# Patient Record
Sex: Male | Born: 1940 | Race: White | Hispanic: No | Marital: Married | Smoking: Never smoker
Health system: Southern US, Community
[De-identification: ages and names within clinical notes are randomized; demographics above are authoritative.]

## PROBLEM LIST (undated history)

## (undated) DIAGNOSIS — G8250 Quadriplegia, unspecified: Secondary | ICD-10-CM

## (undated) DIAGNOSIS — N319 Neuromuscular dysfunction of bladder, unspecified: Secondary | ICD-10-CM

## (undated) DIAGNOSIS — E785 Hyperlipidemia, unspecified: Secondary | ICD-10-CM

## (undated) DIAGNOSIS — I10 Essential (primary) hypertension: Secondary | ICD-10-CM

## (undated) DIAGNOSIS — I4891 Unspecified atrial fibrillation: Secondary | ICD-10-CM

## (undated) DIAGNOSIS — F329 Major depressive disorder, single episode, unspecified: Secondary | ICD-10-CM

## (undated) DIAGNOSIS — A4902 Methicillin resistant Staphylococcus aureus infection, unspecified site: Secondary | ICD-10-CM

## (undated) DIAGNOSIS — I2510 Atherosclerotic heart disease of native coronary artery without angina pectoris: Secondary | ICD-10-CM

## (undated) DIAGNOSIS — F32A Depression, unspecified: Secondary | ICD-10-CM

## (undated) DIAGNOSIS — K604 Rectal fistula, unspecified: Secondary | ICD-10-CM

## (undated) DIAGNOSIS — E119 Type 2 diabetes mellitus without complications: Secondary | ICD-10-CM

## (undated) DIAGNOSIS — J9610 Chronic respiratory failure, unspecified whether with hypoxia or hypercapnia: Secondary | ICD-10-CM

## (undated) DIAGNOSIS — J449 Chronic obstructive pulmonary disease, unspecified: Secondary | ICD-10-CM

## (undated) DIAGNOSIS — Z8679 Personal history of other diseases of the circulatory system: Secondary | ICD-10-CM

## (undated) DIAGNOSIS — J189 Pneumonia, unspecified organism: Secondary | ICD-10-CM

## (undated) HISTORY — PX: NECK SURGERY: SHX720

## (undated) HISTORY — PX: PACEMAKER PLACEMENT: SHX43

## (undated) HISTORY — PX: TRACHEOSTOMY: SUR1362

## (undated) HISTORY — PX: PERIPHERALLY INSERTED CENTRAL CATHETER INSERTION: SHX2221

---

## 2015-07-27 ENCOUNTER — Inpatient Hospital Stay (HOSPITAL_COMMUNITY)
Admission: EM | Admit: 2015-07-27 | Discharge: 2015-08-06 | DRG: 870 | Disposition: A | Discharge status: Other Acute Inpatient Hospital | Payer: Medicare Other | Attending: Internal Medicine | Admitting: Internal Medicine

## 2015-07-27 ENCOUNTER — Emergency Department (HOSPITAL_COMMUNITY): Payer: Medicare Other

## 2015-07-27 ENCOUNTER — Encounter (HOSPITAL_COMMUNITY): Payer: Self-pay

## 2015-07-27 DIAGNOSIS — J189 Pneumonia, unspecified organism: Secondary | ICD-10-CM

## 2015-07-27 DIAGNOSIS — J9621 Acute and chronic respiratory failure with hypoxia: Secondary | ICD-10-CM | POA: Diagnosis present

## 2015-07-27 DIAGNOSIS — R590 Localized enlarged lymph nodes: Secondary | ICD-10-CM | POA: Diagnosis not present

## 2015-07-27 DIAGNOSIS — J9612 Chronic respiratory failure with hypercapnia: Secondary | ICD-10-CM | POA: Diagnosis present

## 2015-07-27 DIAGNOSIS — E662 Morbid (severe) obesity with alveolar hypoventilation: Secondary | ICD-10-CM | POA: Diagnosis present

## 2015-07-27 DIAGNOSIS — B370 Candidal stomatitis: Secondary | ICD-10-CM | POA: Diagnosis present

## 2015-07-27 DIAGNOSIS — I82492 Acute embolism and thrombosis of other specified deep vein of left lower extremity: Secondary | ICD-10-CM | POA: Diagnosis present

## 2015-07-27 DIAGNOSIS — A419 Sepsis, unspecified organism: Secondary | ICD-10-CM | POA: Diagnosis present

## 2015-07-27 DIAGNOSIS — E872 Acidosis: Secondary | ICD-10-CM | POA: Diagnosis not present

## 2015-07-27 DIAGNOSIS — I420 Dilated cardiomyopathy: Secondary | ICD-10-CM | POA: Diagnosis present

## 2015-07-27 DIAGNOSIS — I4891 Unspecified atrial fibrillation: Secondary | ICD-10-CM | POA: Diagnosis present

## 2015-07-27 DIAGNOSIS — I9589 Other hypotension: Secondary | ICD-10-CM | POA: Diagnosis present

## 2015-07-27 DIAGNOSIS — E1122 Type 2 diabetes mellitus with diabetic chronic kidney disease: Secondary | ICD-10-CM | POA: Diagnosis not present

## 2015-07-27 DIAGNOSIS — B964 Proteus (mirabilis) (morganii) as the cause of diseases classified elsewhere: Secondary | ICD-10-CM | POA: Diagnosis not present

## 2015-07-27 DIAGNOSIS — N390 Urinary tract infection, site not specified: Secondary | ICD-10-CM | POA: Diagnosis present

## 2015-07-27 DIAGNOSIS — Z8249 Family history of ischemic heart disease and other diseases of the circulatory system: Secondary | ICD-10-CM

## 2015-07-27 DIAGNOSIS — Z930 Tracheostomy status: Secondary | ICD-10-CM | POA: Diagnosis not present

## 2015-07-27 DIAGNOSIS — B965 Pseudomonas (aeruginosa) (mallei) (pseudomallei) as the cause of diseases classified elsewhere: Secondary | ICD-10-CM | POA: Diagnosis present

## 2015-07-27 DIAGNOSIS — Y95 Nosocomial condition: Secondary | ICD-10-CM | POA: Diagnosis not present

## 2015-07-27 DIAGNOSIS — Z823 Family history of stroke: Secondary | ICD-10-CM | POA: Diagnosis not present

## 2015-07-27 DIAGNOSIS — R0602 Shortness of breath: Secondary | ICD-10-CM

## 2015-07-27 DIAGNOSIS — Z9911 Dependence on respirator [ventilator] status: Secondary | ICD-10-CM | POA: Diagnosis not present

## 2015-07-27 DIAGNOSIS — I130 Hypertensive heart and chronic kidney disease with heart failure and stage 1 through stage 4 chronic kidney disease, or unspecified chronic kidney disease: Secondary | ICD-10-CM | POA: Diagnosis present

## 2015-07-27 DIAGNOSIS — I2510 Atherosclerotic heart disease of native coronary artery without angina pectoris: Secondary | ICD-10-CM | POA: Diagnosis not present

## 2015-07-27 DIAGNOSIS — I071 Rheumatic tricuspid insufficiency: Secondary | ICD-10-CM | POA: Diagnosis present

## 2015-07-27 DIAGNOSIS — J440 Chronic obstructive pulmonary disease with acute lower respiratory infection: Secondary | ICD-10-CM | POA: Diagnosis present

## 2015-07-27 DIAGNOSIS — I5033 Acute on chronic diastolic (congestive) heart failure: Secondary | ICD-10-CM | POA: Diagnosis not present

## 2015-07-27 DIAGNOSIS — Z794 Long term (current) use of insulin: Secondary | ICD-10-CM | POA: Diagnosis not present

## 2015-07-27 DIAGNOSIS — N189 Chronic kidney disease, unspecified: Secondary | ICD-10-CM | POA: Diagnosis present

## 2015-07-27 DIAGNOSIS — R0902 Hypoxemia: Secondary | ICD-10-CM

## 2015-07-27 DIAGNOSIS — J15212 Pneumonia due to Methicillin resistant Staphylococcus aureus: Secondary | ICD-10-CM | POA: Diagnosis present

## 2015-07-27 DIAGNOSIS — D649 Anemia, unspecified: Secondary | ICD-10-CM | POA: Diagnosis not present

## 2015-07-27 DIAGNOSIS — R1319 Other dysphagia: Secondary | ICD-10-CM | POA: Diagnosis not present

## 2015-07-27 DIAGNOSIS — Z950 Presence of cardiac pacemaker: Secondary | ICD-10-CM

## 2015-07-27 DIAGNOSIS — J90 Pleural effusion, not elsewhere classified: Secondary | ICD-10-CM | POA: Diagnosis not present

## 2015-07-27 DIAGNOSIS — Z960 Presence of urogenital implants: Secondary | ICD-10-CM

## 2015-07-27 DIAGNOSIS — N319 Neuromuscular dysfunction of bladder, unspecified: Secondary | ICD-10-CM | POA: Diagnosis present

## 2015-07-27 DIAGNOSIS — F419 Anxiety disorder, unspecified: Secondary | ICD-10-CM | POA: Diagnosis not present

## 2015-07-27 DIAGNOSIS — R591 Generalized enlarged lymph nodes: Secondary | ICD-10-CM | POA: Diagnosis present

## 2015-07-27 DIAGNOSIS — I472 Ventricular tachycardia: Secondary | ICD-10-CM | POA: Diagnosis not present

## 2015-07-27 DIAGNOSIS — N179 Acute kidney failure, unspecified: Secondary | ICD-10-CM | POA: Diagnosis not present

## 2015-07-27 DIAGNOSIS — A4102 Sepsis due to Methicillin resistant Staphylococcus aureus: Secondary | ICD-10-CM | POA: Diagnosis not present

## 2015-07-27 DIAGNOSIS — Z6835 Body mass index (BMI) 35.0-35.9, adult: Secondary | ICD-10-CM | POA: Diagnosis not present

## 2015-07-27 DIAGNOSIS — L89159 Pressure ulcer of sacral region, unspecified stage: Secondary | ICD-10-CM | POA: Diagnosis present

## 2015-07-27 DIAGNOSIS — J9622 Acute and chronic respiratory failure with hypercapnia: Secondary | ICD-10-CM | POA: Diagnosis present

## 2015-07-27 DIAGNOSIS — E785 Hyperlipidemia, unspecified: Secondary | ICD-10-CM | POA: Diagnosis not present

## 2015-07-27 DIAGNOSIS — G8250 Quadriplegia, unspecified: Secondary | ICD-10-CM | POA: Diagnosis present

## 2015-07-27 DIAGNOSIS — Z9889 Other specified postprocedural states: Secondary | ICD-10-CM

## 2015-07-27 DIAGNOSIS — E876 Hypokalemia: Secondary | ICD-10-CM | POA: Diagnosis present

## 2015-07-27 DIAGNOSIS — C801 Malignant (primary) neoplasm, unspecified: Secondary | ICD-10-CM

## 2015-07-27 DIAGNOSIS — Z978 Presence of other specified devices: Secondary | ICD-10-CM | POA: Diagnosis present

## 2015-07-27 DIAGNOSIS — R609 Edema, unspecified: Secondary | ICD-10-CM

## 2015-07-27 HISTORY — DX: Quadriplegia, unspecified: G82.50

## 2015-07-27 HISTORY — DX: Chronic obstructive pulmonary disease, unspecified: J44.9

## 2015-07-27 HISTORY — DX: Pneumonia, unspecified organism: J18.9

## 2015-07-27 HISTORY — DX: Methicillin resistant Staphylococcus aureus infection, unspecified site: A49.02

## 2015-07-27 HISTORY — DX: Atherosclerotic heart disease of native coronary artery without angina pectoris: I25.10

## 2015-07-27 HISTORY — DX: Unspecified atrial fibrillation: I48.91

## 2015-07-27 HISTORY — DX: Major depressive disorder, single episode, unspecified: F32.9

## 2015-07-27 HISTORY — DX: Essential (primary) hypertension: I10

## 2015-07-27 HISTORY — DX: Rectal fistula, unspecified: K60.40

## 2015-07-27 HISTORY — DX: Chronic respiratory failure, unspecified whether with hypoxia or hypercapnia: J96.10

## 2015-07-27 HISTORY — DX: Rectal fistula: K60.4

## 2015-07-27 HISTORY — DX: Hyperlipidemia, unspecified: E78.5

## 2015-07-27 HISTORY — DX: Neuromuscular dysfunction of bladder, unspecified: N31.9

## 2015-07-27 HISTORY — DX: Personal history of other diseases of the circulatory system: Z86.79

## 2015-07-27 HISTORY — DX: Depression, unspecified: F32.A

## 2015-07-27 HISTORY — DX: Type 2 diabetes mellitus without complications: E11.9

## 2015-07-27 LAB — COMPREHENSIVE METABOLIC PANEL
ALBUMIN: 2.1 g/dL — AB (ref 3.5–5.0)
ALK PHOS: 64 U/L (ref 38–126)
ALT: 22 U/L (ref 17–63)
AST: 38 U/L (ref 15–41)
Anion gap: 11 (ref 5–15)
BILIRUBIN TOTAL: 0.6 mg/dL (ref 0.3–1.2)
BUN: 28 mg/dL — AB (ref 6–20)
CALCIUM: 8.7 mg/dL — AB (ref 8.9–10.3)
CO2: 34 mmol/L — ABNORMAL HIGH (ref 22–32)
CREATININE: 0.72 mg/dL (ref 0.61–1.24)
Chloride: 95 mmol/L — ABNORMAL LOW (ref 101–111)
GFR calc Af Amer: >60 mL/min (ref >60)
GLUCOSE: 218 mg/dL — AB (ref 65–99)
POTASSIUM: 3.4 mmol/L — AB (ref 3.5–5.1)
Sodium: 140 mmol/L (ref 135–145)
TOTAL PROTEIN: 7.2 g/dL (ref 6.5–8.1)

## 2015-07-27 LAB — URINE MICROSCOPIC-ADD ON

## 2015-07-27 LAB — I-STAT CG4 LACTIC ACID, ED
LACTIC ACID, VENOUS: 3.32 mmol/L — AB (ref 0.5–2.0)
Lactic Acid, Venous: 3.12 mmol/L (ref 0.5–2.0)

## 2015-07-27 LAB — URINALYSIS, ROUTINE W REFLEX MICROSCOPIC
BILIRUBIN URINE: NEGATIVE
Glucose, UA: NEGATIVE mg/dL
KETONES UR: NEGATIVE mg/dL
NITRITE: NEGATIVE
PH: 8.5 — AB (ref 5.0–8.0)
PROTEIN: 100 mg/dL — AB
Specific Gravity, Urine: 1.016 (ref 1.005–1.030)
Urobilinogen, UA: 1 mg/dL (ref 0.0–1.0)

## 2015-07-27 LAB — CBC WITH DIFFERENTIAL/PLATELET
BASOS ABS: 0 10*3/uL (ref 0.0–0.1)
BASOS PCT: 0 %
EOS ABS: 0.1 10*3/uL (ref 0.0–0.7)
EOS PCT: 0 %
HCT: 29.1 % — ABNORMAL LOW (ref 39.0–52.0)
Hemoglobin: 8.9 g/dL — ABNORMAL LOW (ref 13.0–17.0)
LYMPHS PCT: 5 %
Lymphs Abs: 0.6 10*3/uL — ABNORMAL LOW (ref 0.7–4.0)
MCH: 27.2 pg (ref 26.0–34.0)
MCHC: 30.6 g/dL (ref 30.0–36.0)
MCV: 89 fL (ref 78.0–100.0)
MONO ABS: 0.7 10*3/uL (ref 0.1–1.0)
Monocytes Relative: 5 %
Neutro Abs: 11.4 10*3/uL — ABNORMAL HIGH (ref 1.7–7.7)
Neutrophils Relative %: 90 %
PLATELETS: 280 10*3/uL (ref 150–400)
RBC: 3.27 MIL/uL — AB (ref 4.22–5.81)
RDW: 17.1 % — AB (ref 11.5–15.5)
WBC: 12.7 10*3/uL — AB (ref 4.0–10.5)

## 2015-07-27 LAB — I-STAT TROPONIN, ED
TROPONIN I, POC: 0.01 ng/mL (ref 0.00–0.08)
Troponin i, poc: 0.02 ng/mL (ref 0.00–0.08)
Troponin i, poc: 0 ng/mL (ref 0.00–0.08)

## 2015-07-27 LAB — I-STAT ARTERIAL BLOOD GAS, ED
ACID-BASE EXCESS: 14 mmol/L — AB (ref 0.0–2.0)
BICARBONATE: 37.6 meq/L — AB (ref 20.0–24.0)
O2 SAT: 99 %
PH ART: 7.556 — AB (ref 7.350–7.450)
PO2 ART: 135 mmHg — AB (ref 80.0–100.0)
TCO2: 39 mmol/L (ref 0–100)
pCO2 arterial: 42.3 mmHg (ref 35.0–45.0)

## 2015-07-27 LAB — BRAIN NATRIURETIC PEPTIDE: B Natriuretic Peptide: 481.5 pg/mL — ABNORMAL HIGH (ref 0.0–100.0)

## 2015-07-27 MED ORDER — VANCOMYCIN HCL IN DEXTROSE 1-5 GM/200ML-% IV SOLN
1000.0000 mg | Freq: Once | INTRAVENOUS | Status: AC
  Administered 2015-07-27: 1000 mg via INTRAVENOUS
  Filled 2015-07-27: qty 200

## 2015-07-27 MED ORDER — VANCOMYCIN HCL 10 G IV SOLR
1500.0000 mg | Freq: Two times a day (BID) | INTRAVENOUS | Status: DC
  Administered 2015-07-28 – 2015-07-29 (×3): 1500 mg via INTRAVENOUS
  Filled 2015-07-27 (×6): qty 1500

## 2015-07-27 MED ORDER — PIPERACILLIN-TAZOBACTAM 3.375 G IVPB 30 MIN
3.3750 g | Freq: Once | INTRAVENOUS | Status: AC
  Administered 2015-07-27: 3.375 g via INTRAVENOUS
  Filled 2015-07-27: qty 50

## 2015-07-27 MED ORDER — OXYCODONE HCL ER 10 MG PO T12A
10.0000 mg | EXTENDED_RELEASE_TABLET | Freq: Once | ORAL | Status: AC
  Administered 2015-07-27: 10 mg via ORAL
  Filled 2015-07-27: qty 1

## 2015-07-27 MED ORDER — IOHEXOL 350 MG/ML SOLN
100.0000 mL | Freq: Once | INTRAVENOUS | Status: AC | PRN
  Administered 2015-07-27: 100 mL via INTRAVENOUS

## 2015-07-27 MED ORDER — SODIUM CHLORIDE 0.9 % IV BOLUS (SEPSIS)
2837.0000 mL | Freq: Once | INTRAVENOUS | Status: AC
  Administered 2015-07-27: 2837 mL via INTRAVENOUS

## 2015-07-27 MED ORDER — PIPERACILLIN-TAZOBACTAM 3.375 G IVPB 30 MIN: 3.3750 g | Freq: Three times a day (TID) | INTRAVENOUS | Status: DC

## 2015-07-27 MED ORDER — PIPERACILLIN-TAZOBACTAM 3.375 G IVPB
3.3750 g | Freq: Three times a day (TID) | INTRAVENOUS | Status: DC
  Administered 2015-07-28 – 2015-07-31 (×12): 3.375 g via INTRAVENOUS
  Filled 2015-07-27 (×14): qty 50

## 2015-07-27 MED ORDER — SODIUM CHLORIDE 0.9 % IV BOLUS (SEPSIS)
1000.0000 mL | Freq: Once | INTRAVENOUS | Status: AC
  Administered 2015-07-27: 1000 mL via INTRAVENOUS

## 2015-07-27 NOTE — ED Provider Notes (Signed)
Arrival Date & Time: 07/27/15 & 38 History   Chief Complaint  Patient presents with  . Respiratory Distress   HPI Thomas Zuniga is a 74 y.o. male who [facility] after patient has had long protracted course in out of [facility] and several other hospitals today. Patient has current diagnosis of acute on chronic hypoxic respiratory failure secondary to spinal cord injury and chronic pleural effusion with several recurrent episodes of pneumonia. Patient has methicillin-resistant staph aureus and had been on vancomycin until 1011 and 16. Lesions had prior gastrointestinal bleed spinal shock. Patient had fusion surgery on 782 and 16 at divided [facility] due to fractures of C6 and C7. 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 Patient has known atrial fibrillation however is not on anticoagulation due to severe gastrointestinal rebleed risk. Patient has diagnosis of chronic obstructive pulmonary disease and obesity hypoventilation syndrome. Patient has had candida sepsis active perirectal fistula chronic anemia morbid obesity type 2 diabetes protein calorie malnutrition neurogenic bladder requiring chronic Foley placement and multiple bouts of C. Diff. patient presents today for intermittent episodes of hypoxia and desaturations on ventilator to lower 70s.  A full HPI, Past Medical/Surgical, Family, and Social history and review of systems could not be completed due to limitations of acuity of illness and respiratory distress. Therefore the E&M emergency caveat is invoked. I made several attempts to obtain HPI from other sources. The limited supplemental history I obtained from EMS and wife was used in my medical decision making.   Past Medical History  I  reviewed & agree with nursing's documentation of PMHx, PSHx, SHx & FHx. Past Medical History  Diagnosis Date  . Quadriplegia ([place])   . PNA (pneumonia)     MRSA  . Diabetes mellitus without complication ([place])   . Atrial fibrillation ([place])   . COPD (chronic obstructive pulmonary disease) ([place])   . MRSA (methicillin resistant Staphylococcus aureus)   . Dyslipidemia   . Hypertension   . Depression   . Neurogenic bladder   . H/O ventricular tachycardia   . Respiratory failure, chronic ([facility])     Hypoxic & on Ventilator S/P Trach  . Rectal fistula    Past Surgical History  Procedure Laterality Date  . Neck surgery    . Tracheostomy    . Peripherally inserted central catheter insertion    . Pacemaker placement     Social History   Social History  . Marital Status: Married    Spouse Name: N/A  . Number of Children: N/A  . Years of Education: N/A   Social History Main Topics  . Smoking status: Never Smoker   . Smokeless tobacco: Never Used  . Alcohol Use: No  . Drug Use: Yes     Comment: Remotely used marijuana.  . Sexual Activity: Not Asked   Other Topics Concern  . None   Social History Narrative   Married & previously living in [place]. Patient was hospitalized at [facility] in [place], [place], before being transferred to [facility] in [place] and then to [facility] here in [place].   Family History  Problem Relation Age of Onset  . Heart disease Mother   . Stroke Father     Review of Systems  ROS limited as stated above in HPI.   Allergies  Review of patient's  allergies indicates no known allergies.  Home Medications   Prior to Admission medications   Medication Sig Start Date End Date Taking? Authorizing Provider  acetaminophen (TYLENOL) 325 MG tablet Take 650 mg by mouth every 6 (six) hours as needed for moderate pain.   Yes Historical Provider, MD  ceFEPIme 1 g in dextrose 5 % 50 mL Inject 1 g into the vein every 12 (twelve) hours.    Historical Provider,  MD  chlorhexidine (PERIDEX) 0.12 % solution Use as directed 15 mLs in the mouth or throat 2 (two) times daily.   Yes Historical Provider, MD  clotrimazole-betamethasone (LOTRISONE) cream Apply 1 application topically 2 (two) times daily.   Yes Historical Provider, MD  digoxin (LANOXIN) 0.125 MG tablet Give 0.125 mg by tube daily.   Yes Historical Provider, MD  escitalopram (LEXAPRO) 20 MG tablet Take 20 mg by mouth daily.   Yes Historical Provider, MD  ferrous sulfate 300 (60 FE) MG/5ML syrup Take 300 mg by mouth 2 (two) times daily with a meal.   Yes Historical Provider, MD  fluconazole (DIFLUCAN) 200 MG tablet Give 200 mg by tube daily.    Historical Provider, MD  folic acid (FOLVITE) 1 MG tablet Give 1 mg by tube daily.   Yes Historical Provider, MD  gabapentin (NEURONTIN) 800 MG tablet Take 800 mg by mouth 2 (two) times daily.   Yes Historical Provider, MD  insulin aspart (NOVOLOG) 100 UNIT/ML injection Inject 0-10 Units into the skin every 6 (six) hours. Sliding scale   Yes Historical Provider, MD  ipratropium-albuterol (DUONEB) 0.5-2.5 (3) MG/3ML SOLN Take 3 mLs by nebulization every 4 (four) hours as needed (for SOB).    Historical Provider, MD  lidocaine (XYLOCAINE) 2 % solution Inhale 2 mLs into the lungs every 4 (four) hours as needed (use with nebulizer for cough).    Historical Provider, MD  metoprolol tartrate (LOPRESSOR) 25 MG tablet Take 12.5 mg by mouth 2 (two) times daily.   Yes Historical Provider, MD  midodrine (PROAMATINE) 2.5 MG tablet Take 2.5 mg by mouth 2 (two) times daily with a meal.   Yes Historical Provider, MD  mirtazapine (REMERON) 15 MG tablet Give 15 mg by tube at bedtime.   Yes Historical Provider, MD  Multiple Vitamin (MULTIVITAMIN WITH MINERALS) TABS tablet Give 1 tablet by tube daily.   Yes Historical Provider, MD  ondansetron (ZOFRAN) 4 MG tablet Take 4 mg by mouth every 6 (six) hours as needed for nausea or vomiting.   Yes Historical Provider, MD  pantoprazole  (PROTONIX) 40 MG tablet Give 40 mg by tube daily.   Yes Historical Provider, MD  Probiotic Product (PROBIOTIC FORMULA) CAPS Take 1 capsule by mouth 2 (two) times daily.   Yes Historical Provider, MD  tamsulosin (FLOMAX) 0.4 MG CAPS capsule Give 0.4 mg by tube daily after breakfast.   Yes Historical Provider, MD  torsemide (DEMADEX) 10 MG tablet Take 10 mg by mouth daily.   Yes Historical Provider, MD  vancomycin 1,000 mg in sodium chloride 0.9 % 250 mL Inject 1,000 mg into the vein every 12 (twelve) hours. Starting 07/27/15, for 8 days ending 08/03/15    Historical Provider, MD  vitamin B-12 (CYANOCOBALAMIN) 250 MCG tablet Take 250 mcg by mouth daily.   Yes Historical Provider, MD  Water For Irrigation, Sterile (FREE WATER) SOLN Place 200 mLs into feeding tube every 4 (four) hours.   Yes Historical Provider, MD    Physical Exam  BP 72/41 mmHg  Pulse 70  Temp(Src) 97.7 F (36.5 C) (Axillary)  Resp 20  Ht 6\' 1"  (1.854 m)  Wt 287 lb 11.2 oz (130.5 kg)  BMI 37.97 kg/m2  SpO2 100% Physical Exam Vitals & Nursing notes reviewed. CONST: male, in mild acute distress. Appears WD/WN & stated age. EYES: PERRL. No conjunctival injection & lids symmetrical. ENMT: External nose & ears atraumatic. MM moist. Tracheostomy in place. NECK: Supple, w/o meningismus. Trachea midline w/o JVD. Stridor absent. CVS: S1/S2 audible w/o gallops. Murmur absent. Peripheral pulses 2+ & equal in all extremities. Cap refill < 2 seconds. RESP: Respiratory effort normal. Lungs decreased and coarse, with diffuse rhonchi. GI: Soft, w/o TTP. Guarding & rebound absent. PEG tube in place. BACK: W/o CVA TTP bilaterally. SKIN: Warm & dry. Rash in his inguinal areas.  NEURO: AAOx3. CN II-XIII grossly intact.  Quadriplegia. Tremor absent. PSYCH:  Cooperative, w/ mood & affect appropriate. MSK: Extremities without deformity. Right upper extremity with PICC line. ED Course  Procedures  Labs Review Labs Reviewed  MRSA PCR  SCREENING - Abnormal; Notable for the following:    MRSA by PCR POSITIVE (*)    All other components within normal limits  COMPREHENSIVE METABOLIC PANEL - Abnormal; Notable for the following:    Potassium 3.4 (*)    Chloride 95 (*)    CO2 34 (*)    Glucose, Bld 218 (*)    BUN 28 (*)    Calcium 8.7 (*)    Albumin 2.1 (*)    All other components within normal limits  CBC WITH DIFFERENTIAL/PLATELET - Abnormal; Notable for the following:    WBC 12.7 (*)    RBC 3.27 (*)    Hemoglobin 8.9 (*)    HCT 29.1 (*)    RDW 17.1 (*)    Neutro Abs 11.4 (*)    Lymphs Abs 0.6 (*)    All other components within normal limits  URINALYSIS, ROUTINE W REFLEX MICROSCOPIC (NOT AT [facility]) - Abnormal; Notable for the following:    APPearance TURBID (*)    pH 8.5 (*)    Hgb urine dipstick SMALL (*)    Protein, ur 100 (*)    Leukocytes, UA LARGE (*)    All other components within normal limits  BRAIN NATRIURETIC PEPTIDE - Abnormal; Notable for the following:    B Natriuretic Peptide 481.5 (*)    All other components within normal limits  URINE MICROSCOPIC-ADD ON - Abnormal; Notable for the following:    Squamous Epithelial / LPF FEW (*)    Bacteria, UA MANY (*)    All other components within normal limits  BLOOD GAS, ARTERIAL - Abnormal; Notable for the following:    pH, Arterial 7.626 (*)    pCO2 arterial 29.3 (*)    pO2, Arterial 115 (*)    Bicarbonate 30.9 (*)    Acid-Base Excess 8.6 (*)    All other components within normal limits  RENAL FUNCTION PANEL - Abnormal; Notable for the following:    Chloride 97 (*)    BUN 25 (*)    Calcium 8.1 (*)    Phosphorus 1.8 (*)    Albumin 1.9 (*)    All other components within normal limits  DIGOXIN LEVEL - Abnormal; Notable for the following:    Digoxin Level 0.7 (*)    All other components within normal limits  CBC WITH DIFFERENTIAL/PLATELET - Abnormal; Notable for the following:    RBC 3.25 (*)    Hemoglobin 9.9 (*)  HCT 32.5 (*)    All other  components within normal limits  MAGNESIUM - Abnormal; Notable for the following:    Magnesium 2.6 (*)    All other components within normal limits  FERRITIN - Abnormal; Notable for the following:    Ferritin 653 (*)    All other components within normal limits  IRON AND TIBC - Abnormal; Notable for the following:    TIBC 190 (*)    Saturation Ratios 63 (*)    All other components within normal limits  FOLATE RBC - Abnormal; Notable for the following:    Hematocrit 24.4 (*)    All other components within normal limits  GLUCOSE, CAPILLARY - Abnormal; Notable for the following:    Glucose-Capillary 108 (*)    All other components within normal limits  BLOOD GAS, ARTERIAL - Abnormal; Notable for the following:    pH, Arterial 7.490 (*)    Bicarbonate 31.2 (*)    Acid-Base Excess 7.5 (*)    All other components within normal limits  GLUCOSE, CAPILLARY - Abnormal; Notable for the following:    Glucose-Capillary 119 (*)    All other components within normal limits  GLUCOSE, CAPILLARY - Abnormal; Notable for the following:    Glucose-Capillary 115 (*)    All other components within normal limits  BASIC METABOLIC PANEL - Abnormal; Notable for the following:    Potassium 2.6 (*)    Glucose, Bld 102 (*)    Creatinine, Ser 0.54 (*)    Calcium 8.4 (*)    All other components within normal limits  MAGNESIUM - Abnormal; Notable for the following:    Magnesium 1.6 (*)    All other components within normal limits  PHOSPHORUS - Abnormal; Notable for the following:    Phosphorus 4.9 (*)    All other components within normal limits  CBC - Abnormal; Notable for the following:    WBC 13.1 (*)    RBC 2.98 (*)    Hemoglobin 8.3 (*)    HCT 27.1 (*)    RDW 17.3 (*)    All other components within normal limits  GLUCOSE, CAPILLARY - Abnormal; Notable for the following:    Glucose-Capillary 125 (*)    All other components within normal limits  GLUCOSE, CAPILLARY - Abnormal; Notable for the  following:    Glucose-Capillary 110 (*)    All other components within normal limits  GLUCOSE, CAPILLARY - Abnormal; Notable for the following:    Glucose-Capillary 113 (*)    All other components within normal limits  BASIC METABOLIC PANEL - Abnormal; Notable for the following:    Potassium 2.8 (*)    Glucose, Bld 131 (*)    Creatinine, Ser 0.55 (*)    Calcium 8.2 (*)    All other components within normal limits  GLUCOSE, CAPILLARY - Abnormal; Notable for the following:    Glucose-Capillary 119 (*)    All other components within normal limits  GLUCOSE, CAPILLARY - Abnormal; Notable for the following:    Glucose-Capillary 111 (*)    All other components within normal limits  CBC - Abnormal; Notable for the following:    WBC 10.7 (*)    RBC 2.77 (*)    Hemoglobin 7.4 (*)    HCT 25.4 (*)    MCHC 29.1 (*)    RDW 17.2 (*)    All other components within normal limits  GLUCOSE, CAPILLARY - Abnormal; Notable for the following:    Glucose-Capillary 130 (*)  All other components within normal limits  VANCOMYCIN, TROUGH - Abnormal; Notable for the following:    Vancomycin Tr 27 (*)    All other components within normal limits  HEPARIN LEVEL (UNFRACTIONATED) - Abnormal; Notable for the following:    Heparin Unfractionated 0.29 (*)    All other components within normal limits  CBC - Abnormal; Notable for the following:    WBC 10.6 (*)    RBC 2.91 (*)    Hemoglobin 7.6 (*)    HCT 26.4 (*)    MCHC 28.8 (*)    RDW 17.3 (*)    All other components within normal limits  COMPREHENSIVE METABOLIC PANEL - Abnormal; Notable for the following:    Potassium 2.8 (*)    Glucose, Bld 113 (*)    Creatinine, Ser 0.43 (*)    Calcium 8.2 (*)    Total Protein 5.9 (*)    Albumin 1.8 (*)    ALT 14 (*)    All other components within normal limits  GLUCOSE, CAPILLARY - Abnormal; Notable for the following:    Glucose-Capillary 116 (*)    All other components within normal limits  GLUCOSE,  CAPILLARY - Abnormal; Notable for the following:    Glucose-Capillary 112 (*)    All other components within normal limits  GLUCOSE, CAPILLARY - Abnormal; Notable for the following:    Glucose-Capillary 115 (*)    All other components within normal limits  GLUCOSE, CAPILLARY - Abnormal; Notable for the following:    Glucose-Capillary 105 (*)    All other components within normal limits  POTASSIUM - Abnormal; Notable for the following:    Potassium 3.2 (*)    All other components within normal limits  MAGNESIUM - Abnormal; Notable for the following:    Magnesium 1.6 (*)    All other components within normal limits  GLUCOSE, CAPILLARY - Abnormal; Notable for the following:    Glucose-Capillary 131 (*)    All other components within normal limits  LACTATE DEHYDROGENASE, BODY FLUID - Abnormal; Notable for the following:    LD, Fluid 2372 (*)    All other components within normal limits  BODY FLUID CELL COUNT WITH DIFFERENTIAL - Abnormal; Notable for the following:    Color, Fluid STRAW (*)    Appearance, Fluid HAZY (*)    All other components within normal limits  HEPARIN LEVEL (UNFRACTIONATED) - Abnormal; Notable for the following:    Heparin Unfractionated 1.10 (*)    All other components within normal limits  CBC - Abnormal; Notable for the following:    RBC 3.13 (*)    Hemoglobin 8.1 (*)    HCT 28.5 (*)    MCH 25.9 (*)    MCHC 28.4 (*)    RDW 17.4 (*)    All other components within normal limits  GLUCOSE, CAPILLARY - Abnormal; Notable for the following:    Glucose-Capillary 132 (*)    All other components within normal limits  GLUCOSE, CAPILLARY - Abnormal; Notable for the following:    Glucose-Capillary 112 (*)    All other components within normal limits  GLUCOSE, CAPILLARY - Abnormal; Notable for the following:    Glucose-Capillary 119 (*)    All other components within normal limits  HEMOGLOBIN A1C - Abnormal; Notable for the following:    Hgb A1c MFr Bld 6.1 (*)     All other components within normal limits  LIPID PANEL - Abnormal; Notable for the following:    HDL 20 (*)  All other components within normal limits  GLUCOSE, CAPILLARY - Abnormal; Notable for the following:    Glucose-Capillary 116 (*)    All other components within normal limits  GLUCOSE, CAPILLARY - Abnormal; Notable for the following:    Glucose-Capillary 111 (*)    All other components within normal limits  GLUCOSE, CAPILLARY - Abnormal; Notable for the following:    Glucose-Capillary 112 (*)    All other components within normal limits  GLUCOSE, CAPILLARY - Abnormal; Notable for the following:    Glucose-Capillary 122 (*)    All other components within normal limits  CBC - Abnormal; Notable for the following:    RBC 2.95 (*)    Hemoglobin 7.7 (*)    HCT 26.8 (*)    MCHC 28.7 (*)    RDW 17.1 (*)    All other components within normal limits  BASIC METABOLIC PANEL - Abnormal; Notable for the following:    Potassium 2.8 (*)    Glucose, Bld 115 (*)    Creatinine, Ser 0.45 (*)    Calcium 8.1 (*)    All other components within normal limits  MAGNESIUM - Abnormal; Notable for the following:    Magnesium 1.4 (*)    All other components within normal limits  DIGOXIN LEVEL - Abnormal; Notable for the following:    Digoxin Level 0.3 (*)    All other components within normal limits  GLUCOSE, CAPILLARY - Abnormal; Notable for the following:    Glucose-Capillary 112 (*)    All other components within normal limits  GLUCOSE, CAPILLARY - Abnormal; Notable for the following:    Glucose-Capillary 127 (*)    All other components within normal limits  GLUCOSE, CAPILLARY - Abnormal; Notable for the following:    Glucose-Capillary 106 (*)    All other components within normal limits  GLUCOSE, CAPILLARY - Abnormal; Notable for the following:    Glucose-Capillary 110 (*)    All other components within normal limits  GLUCOSE, CAPILLARY - Abnormal; Notable for the following:     Glucose-Capillary 106 (*)    All other components within normal limits  GLUCOSE, CAPILLARY - Abnormal; Notable for the following:    Glucose-Capillary 119 (*)    All other components within normal limits  POTASSIUM - Abnormal; Notable for the following:    Potassium 2.9 (*)    All other components within normal limits  GLUCOSE, CAPILLARY - Abnormal; Notable for the following:    Glucose-Capillary 123 (*)    All other components within normal limits  GLUCOSE, CAPILLARY - Abnormal; Notable for the following:    Glucose-Capillary 104 (*)    All other components within normal limits  GLUCOSE, CAPILLARY - Abnormal; Notable for the following:    Glucose-Capillary 127 (*)    All other components within normal limits  GLUCOSE, CAPILLARY - Abnormal; Notable for the following:    Glucose-Capillary 106 (*)    All other components within normal limits  I-STAT CG4 LACTIC ACID, ED - Abnormal; Notable for the following:    Lactic Acid, Venous 3.12 (*)    All other components within normal limits  I-STAT ARTERIAL BLOOD GAS, ED - Abnormal; Notable for the following:    pH, Arterial 7.556 (*)    pO2, Arterial 135.0 (*)    Bicarbonate 37.6 (*)    Acid-Base Excess 14.0 (*)    All other components within normal limits  I-STAT CG4 LACTIC ACID, ED - Abnormal; Notable for the following:    Lactic Acid,  Venous 3.32 (*)    All other components within normal limits  CULTURE, BLOOD (ROUTINE X 2)  CULTURE, BLOOD (ROUTINE X 2)  URINE CULTURE  CULTURE, RESPIRATORY (NON-EXPECTORATED)  BODY FLUID CULTURE  MAGNESIUM  PROCALCITONIN  LACTIC ACID, PLASMA  HEPARIN LEVEL (UNFRACTIONATED)  PROCALCITONIN  HEPARIN LEVEL (UNFRACTIONATED)  PROCALCITONIN  PROTEIN, BODY FLUID  GLUCOSE, SEROUS FLUID  AMYLASE, PLEURAL FLUID  PH, BODY FLUID  HEPARIN LEVEL (UNFRACTIONATED)  HEPARIN LEVEL (UNFRACTIONATED)  MAGNESIUM  CBC WITH DIFFERENTIAL/PLATELET  METHYLMALONIC ACID, SERUM  OTHER BODY FLUID CHEMISTRY  HEPARIN  LEVEL (UNFRACTIONATED)  CBC  I-STAT TROPOININ, ED  I-STAT TROPOININ, ED  I-STAT TROPOININ, ED  CYTOLOGY - NON PAP    Imaging Review Ir Fluoro Guide Cv Line Left  08/01/2015  CLINICAL DATA:  Heart failure, tracheostomy, ventilatory support, poor peripheral access EXAM: POWER PICC LINE PLACEMENT WITH ULTRASOUND AND FLUOROSCOPIC GUIDANCE FLUOROSCOPY TIME:  24 seconds PROCEDURE: The patient was advised of the possible risks andcomplications and agreed to undergo the procedure. The patient was then brought to the angiographic suite for the procedure. The right arm was prepped with chlorhexidine, drapedin the usual sterile fashion using maximum barrier technique (cap and mask, sterile gown, sterile gloves, large sterile sheet, hand hygiene and cutaneous antisepsis) and infiltrated locally with 1% Lidocaine. Ultrasound demonstrated patency of the right basilic vein, and this was documented with an image. Under real-time ultrasound guidance, this vein was accessed with a 21 gauge micropuncture needle and image documentation was performed. A 0.018 wire was introduced in to the vein. Over this, a 5 [place] double lumen power PICC was advanced to the lower SVC/right atrial junction. Fluoroscopy during the procedure and fluoro spot radiograph confirms appropriate catheter position. The catheter was flushed and covered with asterile dressing. Catheter length: 54 Complications: None immediate IMPRESSION: Successful right arm power PICC line placement with ultrasound and fluoroscopic guidance. The catheter is ready for use. Electronically Signed   By: Jerilynn Mages.  Shick M.D.   On: 08/01/2015 16:59   Ir US Guide Vasc Access Left  08/01/2015  CLINICAL DATA:  Heart failure, tracheostomy, ventilatory support, poor peripheral access EXAM: POWER PICC LINE PLACEMENT WITH ULTRASOUND AND FLUOROSCOPIC GUIDANCE FLUOROSCOPY TIME:  24 seconds PROCEDURE: The patient was advised of the possible risks andcomplications and agreed to undergo  the procedure. The patient was then brought to the angiographic suite for the procedure. The right arm was prepped with chlorhexidine, drapedin the usual sterile fashion using maximum barrier technique (cap and mask, sterile gown, sterile gloves, large sterile sheet, hand hygiene and cutaneous antisepsis) and infiltrated locally with 1% Lidocaine. Ultrasound demonstrated patency of the right basilic vein, and this was documented with an image. Under real-time ultrasound guidance, this vein was accessed with a 21 gauge micropuncture needle and image documentation was performed. A 0.018 wire was introduced in to the vein. Over this, a 5 [place] double lumen power PICC was advanced to the lower SVC/right atrial junction. Fluoroscopy during the procedure and fluoro spot radiograph confirms appropriate catheter position. The catheter was flushed and covered with asterile dressing. Catheter length: 54 Complications: None immediate IMPRESSION: Successful right arm power PICC line placement with ultrasound and fluoroscopic guidance. The catheter is ready for use. Electronically Signed   By: Jerilynn Mages.  Shick M.D.   On: 08/01/2015 16:59    Laboratory and Imaging results were personally reviewed by myself and used in the medical decision making of this patient's treatment and disposition.  EKG Interpretation  EKG Interpretation  Date/Time:  Saturday July 27 2015 16:06:50 EST Ventricular Rate:  87 PR Interval:    QRS Duration: 100 QT Interval:  537 QTC Calculation: 646 R Axis:   61 Text Interpretation:  Atrial fibrillation Borderline repolarization abnormality No old tracing to compare Confirmed by Kathrynn Humble, MD, Thelma Comp [phone]) on 07/27/2015 5:25:57 PM      MDM  Thomas Zuniga is a 74 y.o. male with H&P as above. ED clinical course as follows:  Patient had recent change of Shiley trach per nursing home and EMS. Patient also presents with concerns of worsening pneumonia due to chest x-ray that was taken today and  shows worsening right pleural effusion versus right infiltrate. Patient had CT a chest obtained which upon review shows no pulmonary embolism and moderate size right pleural effusion and right lower lobe consolidation. No pericardial effusion.  Patient presentation consistent with acute on chronic hypoxic respiratory failure and given imaging concern for hospital-acquired pneumonia. Therefore in light of elevated lactic acidosis patient was treated as a code sepsis and given fluid resuscitation to 30 mL per KG along with starting intravenous vancomycin and Zosyn. Blood cultures 2 and urine cultures obtained prior to admission duration of intravenous antibiotics.  In light of patient's recent MRSA pneumonia suspect patient has acute worsening. Laboratory work reveals acute on chronic renal failure and due to patient's multiple comorbidities consultation was placed to critical care team who after we discussed patient's ED clinical course and discussed patient's imaging with laboratory work decision was made to admit patient to ICU level of care at this time.patient stabilized the best of my ability during his care in the emergency department.  Disposition: Admit  Clinical Impression:  1. SOB (shortness of breath)   2. Hypoxia   3. Edema   4. Acute on chronic renal failure ([place])   5. 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 S/P thoracentesis   7. Sepsis due to methicillin resistant Staphylococcus aureus (MRSA) ([place])   8. Pneumonia    Patient care discussed with Dr. Kathrynn Humble, who oversaw their evaluation & treatment & voiced agreement. House Officer: Voncille Lo, MD, Emergency Medicine. Is a is a is a is a fall ran off her her is a a little bit does not IS is also  Voncille Lo, MD 08/02/15 [place], MD 08/02/15 [phone]

## 2015-07-27 NOTE — Progress Notes (Signed)
Pt back from CT. PEEP to 8. Sats 100% FiO2 .40. Pt is stable at this time.

## 2015-07-27 NOTE — ED Notes (Signed)
Pt arrived via carelink from [facility] c/o respiratory distress recently diagnosed with PNA desat throughout the day.  Pt has proximal trach, PEG RUQ, PICC right arm, quaddriplegia.

## 2015-07-27 NOTE — Progress Notes (Signed)
Pt is stable at this time and sleeping.

## 2015-07-27 NOTE — Progress Notes (Signed)
RT at bedside ready for transport to CT.

## 2015-07-27 NOTE — Progress Notes (Addendum)
ANTIBIOTIC CONSULT NOTE - INITIAL  Pharmacy Consult for vanc/zosyn Indication: pneumonia  Allergies not on file  Patient Measurements:   Adjusted Body Weight:   Vital Signs:   Intake/Output from previous day:   Intake/Output from this shift:    Labs: No results for input(s): WBC, HGB, PLT, LABCREA, CREATININE in the last Thomas hours. CrCl cannot be calculated (Unknown ideal weight.). No results for input(s): VANCOTROUGH, VANCOPEAK, VANCORANDOM, GENTTROUGH, GENTPEAK, GENTRANDOM, TOBRATROUGH, TOBRAPEAK, TOBRARND, AMIKACINPEAK, AMIKACINTROU, AMIKACIN in the last Thomas hours.   Microbiology: No results found for this or any previous visit (from the past 720 hour(s)).  Medical History: No past medical history on file.  Medications:  Scheduled:   Infusions:  . piperacillin-tazobactam    . vancomycin     Assessment: Thomas Zuniga who was tx from [facility] today for PNA. Pt has a hx quadriplegia. He was prescribed vanc/cefepime there before he was tx here but never got any doses. He is morbidly obese (confirmed with the Rn there).   Goal of Therapy:  Vancomycin trough level 15-20 mcg/ml  Plan:   Vanc 2g x1 vanc 1.5g IV q12 Zosyn 3.375g IV q8 F/u with baseline scr Level as needed  Onnie Boer, PharmD Pager: [phone] 07/27/2015 4:33 PM

## 2015-07-27 NOTE — ED Notes (Signed)
Dr Freda Munro notified pt meets sepsis criteria

## 2015-07-27 NOTE — ED Notes (Signed)
CT ready for PT respiratory notified will be here in 10 minutes

## 2015-07-28 ENCOUNTER — Inpatient Hospital Stay (HOSPITAL_COMMUNITY): Payer: Medicare Other

## 2015-07-28 ENCOUNTER — Other Ambulatory Visit: Payer: Self-pay

## 2015-07-28 ENCOUNTER — Encounter (HOSPITAL_COMMUNITY): Payer: Self-pay | Admitting: Pulmonary Disease

## 2015-07-28 DIAGNOSIS — J9622 Acute and chronic respiratory failure with hypercapnia: Secondary | ICD-10-CM | POA: Diagnosis not present

## 2015-07-28 DIAGNOSIS — I2510 Atherosclerotic heart disease of native coronary artery without angina pectoris: Secondary | ICD-10-CM | POA: Diagnosis present

## 2015-07-28 DIAGNOSIS — B964 Proteus (mirabilis) (morganii) as the cause of diseases classified elsewhere: Secondary | ICD-10-CM | POA: Diagnosis present

## 2015-07-28 DIAGNOSIS — I472 Ventricular tachycardia: Secondary | ICD-10-CM | POA: Diagnosis present

## 2015-07-28 DIAGNOSIS — E876 Hypokalemia: Secondary | ICD-10-CM | POA: Diagnosis present

## 2015-07-28 DIAGNOSIS — R609 Edema, unspecified: Secondary | ICD-10-CM | POA: Diagnosis not present

## 2015-07-28 DIAGNOSIS — E662 Morbid (severe) obesity with alveolar hypoventilation: Secondary | ICD-10-CM | POA: Diagnosis present

## 2015-07-28 DIAGNOSIS — J948 Other specified pleural conditions: Secondary | ICD-10-CM

## 2015-07-28 DIAGNOSIS — G8250 Quadriplegia, unspecified: Secondary | ICD-10-CM

## 2015-07-28 DIAGNOSIS — Y95 Nosocomial condition: Secondary | ICD-10-CM | POA: Diagnosis present

## 2015-07-28 DIAGNOSIS — R7881 Bacteremia: Secondary | ICD-10-CM | POA: Diagnosis not present

## 2015-07-28 DIAGNOSIS — N390 Urinary tract infection, site not specified: Secondary | ICD-10-CM | POA: Diagnosis present

## 2015-07-28 DIAGNOSIS — F419 Anxiety disorder, unspecified: Secondary | ICD-10-CM | POA: Diagnosis present

## 2015-07-28 DIAGNOSIS — Z9889 Other specified postprocedural states: Secondary | ICD-10-CM | POA: Diagnosis not present

## 2015-07-28 DIAGNOSIS — B965 Pseudomonas (aeruginosa) (mallei) (pseudomallei) as the cause of diseases classified elsewhere: Secondary | ICD-10-CM | POA: Diagnosis present

## 2015-07-28 DIAGNOSIS — J9612 Chronic respiratory failure with hypercapnia: Secondary | ICD-10-CM | POA: Diagnosis present

## 2015-07-28 DIAGNOSIS — R21 Rash and other nonspecific skin eruption: Secondary | ICD-10-CM

## 2015-07-28 DIAGNOSIS — E872 Acidosis: Secondary | ICD-10-CM | POA: Diagnosis present

## 2015-07-28 DIAGNOSIS — J440 Chronic obstructive pulmonary disease with acute lower respiratory infection: Secondary | ICD-10-CM | POA: Diagnosis present

## 2015-07-28 DIAGNOSIS — J9621 Acute and chronic respiratory failure with hypoxia: Secondary | ICD-10-CM | POA: Diagnosis present

## 2015-07-28 DIAGNOSIS — Z823 Family history of stroke: Secondary | ICD-10-CM | POA: Diagnosis not present

## 2015-07-28 DIAGNOSIS — N189 Chronic kidney disease, unspecified: Secondary | ICD-10-CM | POA: Diagnosis present

## 2015-07-28 DIAGNOSIS — D649 Anemia, unspecified: Secondary | ICD-10-CM | POA: Diagnosis present

## 2015-07-28 DIAGNOSIS — N319 Neuromuscular dysfunction of bladder, unspecified: Secondary | ICD-10-CM | POA: Diagnosis present

## 2015-07-28 DIAGNOSIS — Z9911 Dependence on respirator [ventilator] status: Secondary | ICD-10-CM | POA: Diagnosis not present

## 2015-07-28 DIAGNOSIS — L89159 Pressure ulcer of sacral region, unspecified stage: Secondary | ICD-10-CM | POA: Diagnosis not present

## 2015-07-28 DIAGNOSIS — T83511D Infection and inflammatory reaction due to indwelling urethral catheter, subsequent encounter: Secondary | ICD-10-CM | POA: Diagnosis not present

## 2015-07-28 DIAGNOSIS — Z6835 Body mass index (BMI) 35.0-35.9, adult: Secondary | ICD-10-CM | POA: Diagnosis not present

## 2015-07-28 DIAGNOSIS — Z794 Long term (current) use of insulin: Secondary | ICD-10-CM | POA: Diagnosis not present

## 2015-07-28 DIAGNOSIS — R1319 Other dysphagia: Secondary | ICD-10-CM | POA: Diagnosis present

## 2015-07-28 DIAGNOSIS — E875 Hyperkalemia: Secondary | ICD-10-CM

## 2015-07-28 DIAGNOSIS — Z950 Presence of cardiac pacemaker: Secondary | ICD-10-CM | POA: Diagnosis not present

## 2015-07-28 DIAGNOSIS — I82492 Acute embolism and thrombosis of other specified deep vein of left lower extremity: Secondary | ICD-10-CM | POA: Diagnosis present

## 2015-07-28 DIAGNOSIS — I071 Rheumatic tricuspid insufficiency: Secondary | ICD-10-CM | POA: Diagnosis present

## 2015-07-28 DIAGNOSIS — J15212 Pneumonia due to Methicillin resistant Staphylococcus aureus: Secondary | ICD-10-CM | POA: Diagnosis present

## 2015-07-28 DIAGNOSIS — N179 Acute kidney failure, unspecified: Secondary | ICD-10-CM | POA: Diagnosis present

## 2015-07-28 DIAGNOSIS — B370 Candidal stomatitis: Secondary | ICD-10-CM | POA: Diagnosis present

## 2015-07-28 DIAGNOSIS — I4891 Unspecified atrial fibrillation: Secondary | ICD-10-CM | POA: Diagnosis present

## 2015-07-28 DIAGNOSIS — Z8249 Family history of ischemic heart disease and other diseases of the circulatory system: Secondary | ICD-10-CM | POA: Diagnosis not present

## 2015-07-28 DIAGNOSIS — I420 Dilated cardiomyopathy: Secondary | ICD-10-CM | POA: Diagnosis present

## 2015-07-28 DIAGNOSIS — J690 Pneumonitis due to inhalation of food and vomit: Secondary | ICD-10-CM

## 2015-07-28 DIAGNOSIS — J9601 Acute respiratory failure with hypoxia: Secondary | ICD-10-CM | POA: Diagnosis not present

## 2015-07-28 DIAGNOSIS — J189 Pneumonia, unspecified organism: Secondary | ICD-10-CM | POA: Diagnosis present

## 2015-07-28 DIAGNOSIS — Z9289 Personal history of other medical treatment: Secondary | ICD-10-CM | POA: Diagnosis not present

## 2015-07-28 DIAGNOSIS — R0602 Shortness of breath: Secondary | ICD-10-CM | POA: Diagnosis present

## 2015-07-28 DIAGNOSIS — F329 Major depressive disorder, single episode, unspecified: Secondary | ICD-10-CM

## 2015-07-28 DIAGNOSIS — I130 Hypertensive heart and chronic kidney disease with heart failure and stage 1 through stage 4 chronic kidney disease, or unspecified chronic kidney disease: Secondary | ICD-10-CM | POA: Diagnosis present

## 2015-07-28 DIAGNOSIS — I9589 Other hypotension: Secondary | ICD-10-CM | POA: Diagnosis present

## 2015-07-28 DIAGNOSIS — J90 Pleural effusion, not elsewhere classified: Secondary | ICD-10-CM | POA: Diagnosis present

## 2015-07-28 DIAGNOSIS — I82402 Acute embolism and thrombosis of unspecified deep veins of left lower extremity: Secondary | ICD-10-CM | POA: Diagnosis not present

## 2015-07-28 DIAGNOSIS — I5033 Acute on chronic diastolic (congestive) heart failure: Secondary | ICD-10-CM | POA: Diagnosis present

## 2015-07-28 DIAGNOSIS — E785 Hyperlipidemia, unspecified: Secondary | ICD-10-CM | POA: Diagnosis present

## 2015-07-28 DIAGNOSIS — A4102 Sepsis due to Methicillin resistant Staphylococcus aureus: Secondary | ICD-10-CM | POA: Diagnosis present

## 2015-07-28 DIAGNOSIS — Z930 Tracheostomy status: Secondary | ICD-10-CM | POA: Diagnosis not present

## 2015-07-28 DIAGNOSIS — R590 Localized enlarged lymph nodes: Secondary | ICD-10-CM | POA: Diagnosis present

## 2015-07-28 DIAGNOSIS — I509 Heart failure, unspecified: Secondary | ICD-10-CM | POA: Diagnosis not present

## 2015-07-28 DIAGNOSIS — A419 Sepsis, unspecified organism: Secondary | ICD-10-CM | POA: Diagnosis not present

## 2015-07-28 DIAGNOSIS — R600 Localized edema: Secondary | ICD-10-CM | POA: Diagnosis not present

## 2015-07-28 DIAGNOSIS — E1122 Type 2 diabetes mellitus with diabetic chronic kidney disease: Secondary | ICD-10-CM | POA: Diagnosis present

## 2015-07-28 LAB — BLOOD GAS, ARTERIAL
ACID-BASE EXCESS: 8.6 mmol/L — AB (ref 0.0–2.0)
Acid-Base Excess: 7.5 mmol/L — ABNORMAL HIGH (ref 0.0–2.0)
BICARBONATE: 30.9 meq/L — AB (ref 20.0–24.0)
BICARBONATE: 31.2 meq/L — AB (ref 20.0–24.0)
Drawn by: 236041
Drawn by: 23604
FIO2: 0.4
FIO2: 30
LHR: 22 {breaths}/min
LHR: 17 {breaths}/min
O2 SAT: 96.6 %
O2 Saturation: 99.1 %
PATIENT TEMPERATURE: 98.6
PATIENT TEMPERATURE: 98.6
PCO2 ART: 29.3 mmHg — AB (ref 35.0–45.0)
PCO2 ART: 41.3 mmHg (ref 35.0–45.0)
PEEP/CPAP: 5 cmH2O
PEEP: 8 cmH2O
PH ART: 7.49 — AB (ref 7.350–7.450)
PO2 ART: 115 mmHg — AB (ref 80.0–100.0)
PO2 ART: 81.9 mmHg (ref 80.0–100.0)
TCO2: 31.8 mmol/L (ref 0–100)
TCO2: 32.5 mmol/L (ref 0–100)
VT: 640 mL
VT: 480 mL
pH, Arterial: 7.626 (ref 7.350–7.450)

## 2015-07-28 LAB — CBC WITH DIFFERENTIAL/PLATELET
BASOS ABS: 0 10*3/uL (ref 0.0–0.1)
BASOS PCT: 1 %
EOS ABS: 0.1 10*3/uL (ref 0.0–0.7)
EOS PCT: 1 %
HCT: 32.5 % — ABNORMAL LOW (ref 39.0–52.0)
HEMOGLOBIN: 9.9 g/dL — AB (ref 13.0–17.0)
LYMPHS ABS: 1.1 10*3/uL (ref 0.7–4.0)
Lymphocytes Relative: 14 %
MCH: 30.5 pg (ref 26.0–34.0)
MCHC: 30.5 g/dL (ref 30.0–36.0)
MCV: 100 fL (ref 78.0–100.0)
Monocytes Absolute: 0.9 10*3/uL (ref 0.1–1.0)
Monocytes Relative: 12 %
NEUTROS PCT: 73 %
Neutro Abs: 5.7 10*3/uL (ref 1.7–7.7)
PLATELETS: 200 10*3/uL (ref 150–400)
RBC: 3.25 MIL/uL — AB (ref 4.22–5.81)
RDW: 14.9 % (ref 11.5–15.5)
WBC: 7.8 10*3/uL (ref 4.0–10.5)

## 2015-07-28 LAB — RENAL FUNCTION PANEL
Albumin: 1.9 g/dL — ABNORMAL LOW (ref 3.5–5.0)
Anion gap: 11 (ref 5–15)
BUN: 25 mg/dL — ABNORMAL HIGH (ref 6–20)
CHLORIDE: 97 mmol/L — AB (ref 101–111)
CO2: 31 mmol/L (ref 22–32)
CREATININE: 0.66 mg/dL (ref 0.61–1.24)
Calcium: 8.1 mg/dL — ABNORMAL LOW (ref 8.9–10.3)
GFR calc non Af Amer: >60 mL/min (ref >60)
Glucose, Bld: 90 mg/dL (ref 65–99)
POTASSIUM: 4.2 mmol/L (ref 3.5–5.1)
Phosphorus: 1.8 mg/dL — ABNORMAL LOW (ref 2.5–4.6)
Sodium: 139 mmol/L (ref 135–145)

## 2015-07-28 LAB — IRON AND TIBC
IRON: 120 ug/dL (ref 45–182)
SATURATION RATIOS: 63 % — AB (ref 17.9–39.5)
TIBC: 190 ug/dL — AB (ref 250–450)
UIBC: 70 ug/dL

## 2015-07-28 LAB — MAGNESIUM
MAGNESIUM: 2.6 mg/dL — AB (ref 1.7–2.4)
Magnesium: 1.7 mg/dL (ref 1.7–2.4)

## 2015-07-28 LAB — PROCALCITONIN: PROCALCITONIN: 2.4 ng/mL

## 2015-07-28 LAB — GLUCOSE, CAPILLARY
Glucose-Capillary: 108 mg/dL — ABNORMAL HIGH (ref 65–99)
Glucose-Capillary: 119 mg/dL — ABNORMAL HIGH (ref 65–99)
Glucose-Capillary: 115 mg/dL — ABNORMAL HIGH (ref 65–99)
Glucose-Capillary: 125 mg/dL — ABNORMAL HIGH (ref 65–99)
Glucose-Capillary: 110 mg/dL — ABNORMAL HIGH (ref 65–99)

## 2015-07-28 LAB — LACTIC ACID, PLASMA: Lactic Acid, Venous: 0.6 mmol/L (ref 0.5–2.0)

## 2015-07-28 LAB — DIGOXIN LEVEL: DIGOXIN LVL: 0.7 ng/mL — AB (ref 0.8–2.0)

## 2015-07-28 LAB — FERRITIN: FERRITIN: 653 ng/mL — AB (ref 24–336)

## 2015-07-28 LAB — MRSA PCR SCREENING: MRSA BY PCR: POSITIVE — AB

## 2015-07-28 MED ORDER — VITAL HIGH PROTEIN PO LIQD
1000.0000 mL | ORAL | Status: DC
  Administered 2015-07-28 – 2015-08-06 (×11): 1000 mL
  Filled 2015-07-28 (×11): qty 1000

## 2015-07-28 MED ORDER — DEXTROSE 5 % IV SOLN
30.0000 mmol | Freq: Once | INTRAVENOUS | Status: AC
  Administered 2015-07-28: 30 mmol via INTRAVENOUS
  Filled 2015-07-28: qty 10

## 2015-07-28 MED ORDER — MUPIROCIN 2 % EX OINT
1.0000 "application " | TOPICAL_OINTMENT | Freq: Two times a day (BID) | CUTANEOUS | Status: AC
  Administered 2015-07-28 – 2015-08-01 (×10): 1 via NASAL
  Filled 2015-07-28: qty 22

## 2015-07-28 MED ORDER — METOPROLOL TARTRATE 1 MG/ML IV SOLN
INTRAVENOUS | Status: AC
  Filled 2015-07-28: qty 5

## 2015-07-28 MED ORDER — INSULIN ASPART 100 UNIT/ML ~~LOC~~ SOLN: 0.0000 [IU] | SUBCUTANEOUS | Status: DC

## 2015-07-28 MED ORDER — SODIUM CHLORIDE 0.9 % IJ SOLN
10.0000 mL | Freq: Two times a day (BID) | INTRAMUSCULAR | Status: DC
  Administered 2015-07-28: 10 mL

## 2015-07-28 MED ORDER — ACETAMINOPHEN 160 MG/5ML PO SOLN: 650.0000 mg | Freq: Four times a day (QID) | ORAL | Status: DC | PRN

## 2015-07-28 MED ORDER — GERHARDT'S BUTT CREAM
TOPICAL_CREAM | CUTANEOUS | Status: DC | PRN
  Administered 2015-07-28 – 2015-07-29 (×4): via TOPICAL
  Filled 2015-07-28 (×2): qty 1

## 2015-07-28 MED ORDER — CYANOCOBALAMIN 250 MCG PO TABS
250.0000 ug | ORAL_TABLET | Freq: Every day | ORAL | Status: DC
  Administered 2015-07-28 – 2015-08-06 (×10): 250 ug
  Filled 2015-07-28 (×10): qty 1

## 2015-07-28 MED ORDER — DIGOXIN 125 MCG PO TABS
0.1250 mg | ORAL_TABLET | Freq: Every day | ORAL | Status: DC
  Administered 2015-07-28 – 2015-08-06 (×10): 0.125 mg
  Filled 2015-07-28 (×11): qty 1

## 2015-07-28 MED ORDER — ONDANSETRON HCL 4 MG/2ML IJ SOLN
4.0000 mg | Freq: Four times a day (QID) | INTRAMUSCULAR | Status: DC | PRN
  Filled 2015-07-28: qty 2

## 2015-07-28 MED ORDER — VITAL HIGH PROTEIN PO LIQD: 1000.0000 mL | ORAL | Status: DC

## 2015-07-28 MED ORDER — PANTOPRAZOLE SODIUM 40 MG IV SOLR: 40.0000 mg | INTRAVENOUS | Status: DC

## 2015-07-28 MED ORDER — HEPARIN BOLUS VIA INFUSION
5000.0000 [IU] | Freq: Once | INTRAVENOUS | Status: DC
  Filled 2015-07-28: qty 5000

## 2015-07-28 MED ORDER — IPRATROPIUM-ALBUTEROL 0.5-2.5 (3) MG/3ML IN SOLN
3.0000 mL | Freq: Four times a day (QID) | RESPIRATORY_TRACT | Status: DC
  Administered 2015-07-28 – 2015-07-29 (×5): 3 mL via RESPIRATORY_TRACT
  Filled 2015-07-28 (×5): qty 3

## 2015-07-28 MED ORDER — PANTOPRAZOLE SODIUM 40 MG IV SOLR
40.0000 mg | Freq: Two times a day (BID) | INTRAVENOUS | Status: DC
  Administered 2015-07-28 – 2015-07-30 (×5): 40 mg via INTRAVENOUS
  Filled 2015-07-28 (×6): qty 40

## 2015-07-28 MED ORDER — ESCITALOPRAM OXALATE 10 MG PO TABS
20.0000 mg | ORAL_TABLET | Freq: Every day | ORAL | Status: DC
  Administered 2015-07-28 – 2015-08-06 (×10): 20 mg
  Filled 2015-07-28: qty 1
  Filled 2015-07-28 (×4): qty 2
  Filled 2015-07-28: qty 1
  Filled 2015-07-28 (×4): qty 2

## 2015-07-28 MED ORDER — PRO-STAT SUGAR FREE PO LIQD
60.0000 mL | Freq: Every day | ORAL | Status: DC
  Administered 2015-07-28 – 2015-08-06 (×45): 60 mL
  Filled 2015-07-28 (×45): qty 60

## 2015-07-28 MED ORDER — CHLORHEXIDINE GLUCONATE CLOTH 2 % EX PADS
6.0000 | MEDICATED_PAD | Freq: Every day | CUTANEOUS | Status: AC
  Administered 2015-07-28 – 2015-08-01 (×5): 6 via TOPICAL

## 2015-07-28 MED ORDER — INSULIN ASPART 100 UNIT/ML ~~LOC~~ SOLN
0.0000 [IU] | SUBCUTANEOUS | Status: DC
  Administered 2015-07-28 – 2015-07-30 (×2): 1 [IU] via SUBCUTANEOUS
  Administered 2015-07-30: 0 [IU] via SUBCUTANEOUS
  Administered 2015-07-30 – 2015-08-06 (×8): 1 [IU] via SUBCUTANEOUS

## 2015-07-28 MED ORDER — MIRTAZAPINE 15 MG PO TABS
15.0000 mg | ORAL_TABLET | Freq: Every day | ORAL | Status: DC
  Administered 2015-07-28 – 2015-08-05 (×9): 15 mg
  Filled 2015-07-28 (×10): qty 1

## 2015-07-28 MED ORDER — HEPARIN (PORCINE) IN NACL 100-0.45 UNIT/ML-% IJ SOLN
1850.0000 [IU]/h | INTRAMUSCULAR | Status: DC
  Administered 2015-07-28 – 2015-07-30 (×4): 1800 [IU]/h via INTRAVENOUS
  Administered 2015-07-30 – 2015-08-06 (×11): 1850 [IU]/h via INTRAVENOUS
  Filled 2015-07-28 (×18): qty 250

## 2015-07-28 MED ORDER — FREE WATER
200.0000 mL | Status: DC
  Administered 2015-07-28 – 2015-07-31 (×22): 200 mL

## 2015-07-28 MED ORDER — SODIUM CHLORIDE 0.9 % IJ SOLN
10.0000 mL | INTRAMUSCULAR | Status: DC | PRN
  Administered 2015-07-28: 20 mL
  Filled 2015-07-28: qty 40

## 2015-07-28 MED ORDER — SODIUM CHLORIDE 0.9 % IV SOLN
INTRAVENOUS | Status: DC
  Administered 2015-07-28: 04:00:00 via INTRAVENOUS
  Administered 2015-07-28: 125 mL/h via INTRAVENOUS
  Administered 2015-07-29: 125 mL via INTRAVENOUS
  Administered 2015-07-30 – 2015-07-31 (×3): via INTRAVENOUS
  Administered 2015-08-02: 50 mL/h via INTRAVENOUS
  Administered 2015-08-02 – 2015-08-03 (×2): via INTRAVENOUS

## 2015-07-28 MED ORDER — HEPARIN SODIUM (PORCINE) 5000 UNIT/ML IJ SOLN: 5000.0000 [IU] | Freq: Three times a day (TID) | INTRAMUSCULAR | Status: DC

## 2015-07-28 MED ORDER — ANTISEPTIC ORAL RINSE SOLUTION (CORINZ)
7.0000 mL | Freq: Four times a day (QID) | OROMUCOSAL | Status: DC
  Administered 2015-07-28 – 2015-08-06 (×33): 7 mL via OROMUCOSAL

## 2015-07-28 MED ORDER — FOLIC ACID 1 MG PO TABS
1.0000 mg | ORAL_TABLET | Freq: Every day | ORAL | Status: DC
  Administered 2015-07-28 – 2015-08-06 (×10): 1 mg
  Filled 2015-07-28 (×10): qty 1

## 2015-07-28 MED ORDER — FENTANYL CITRATE (PF) 100 MCG/2ML IJ SOLN
100.0000 ug | INTRAMUSCULAR | Status: DC | PRN
  Administered 2015-07-28 – 2015-08-01 (×4): 100 ug via INTRAVENOUS
  Filled 2015-07-28 (×4): qty 2

## 2015-07-28 MED ORDER — SODIUM CHLORIDE 0.9 % IV SOLN: 250.0000 mL | INTRAVENOUS | Status: DC | PRN

## 2015-07-28 MED ORDER — METOPROLOL TARTRATE 25 MG/10 ML ORAL SUSPENSION
12.5000 mg | Freq: Two times a day (BID) | ORAL | Status: DC
  Administered 2015-07-28 – 2015-08-05 (×15): 12.5 mg
  Filled 2015-07-28: qty 10
  Filled 2015-07-28: qty 5
  Filled 2015-07-28 (×2): qty 10
  Filled 2015-07-28: qty 5
  Filled 2015-07-28 (×14): qty 10
  Filled 2015-07-28: qty 5

## 2015-07-28 MED ORDER — FLUCONAZOLE 200 MG PO TABS
200.0000 mg | ORAL_TABLET | Freq: Every day | ORAL | Status: DC
  Administered 2015-07-28 – 2015-08-06 (×10): 200 mg
  Filled 2015-07-28 (×10): qty 1

## 2015-07-28 MED ORDER — CHLORHEXIDINE GLUCONATE 0.12% ORAL RINSE (MEDLINE KIT)
15.0000 mL | Freq: Two times a day (BID) | OROMUCOSAL | Status: DC
  Administered 2015-07-28 – 2015-08-06 (×17): 15 mL via OROMUCOSAL

## 2015-07-28 MED ORDER — HEPARIN BOLUS VIA INFUSION
4000.0000 [IU] | Freq: Once | INTRAVENOUS | Status: AC
  Administered 2015-07-28: 4000 [IU] via INTRAVENOUS
  Filled 2015-07-28: qty 4000

## 2015-07-28 MED ORDER — MIDODRINE HCL 2.5 MG PO TABS
2.5000 mg | ORAL_TABLET | Freq: Two times a day (BID) | ORAL | Status: DC
Start: 2015-07-28 — End: 2015-07-29
  Administered 2015-07-28 – 2015-07-29 (×3): 2.5 mg
  Filled 2015-07-28 (×5): qty 1

## 2015-07-28 MED ORDER — GABAPENTIN 250 MG/5ML PO SOLN
800.0000 mg | Freq: Two times a day (BID) | ORAL | Status: DC
  Administered 2015-07-28 – 2015-08-06 (×19): 800 mg
  Filled 2015-07-28 (×23): qty 16

## 2015-07-28 MED ORDER — NYSTATIN 100000 UNIT/GM EX POWD
Freq: Three times a day (TID) | CUTANEOUS | Status: DC
  Administered 2015-07-28 – 2015-07-31 (×12): via TOPICAL
  Administered 2015-08-01: 1 g via TOPICAL
  Administered 2015-08-01 – 2015-08-02 (×5): via TOPICAL
  Administered 2015-08-02: 1 g via TOPICAL
  Administered 2015-08-03 – 2015-08-04 (×4): via TOPICAL
  Administered 2015-08-04 – 2015-08-05 (×2): 1 g via TOPICAL
  Administered 2015-08-05 – 2015-08-06 (×4): via TOPICAL
  Filled 2015-07-28 (×2): qty 15

## 2015-07-28 MED ORDER — SODIUM CHLORIDE 0.9 % IJ SOLN: 10.0000 mL | INTRAMUSCULAR | Status: DC | PRN

## 2015-07-28 NOTE — Progress Notes (Signed)
Dr. Melvyn Novas made aware that patient has had no urine output for 3 hours. Per bladder scan- 300cc in bladder. Order received to irrigate foley. Will continue to closely monitor. Richarda Blade RN

## 2015-07-28 NOTE — Plan of Care (Signed)
Dr. Nelda Marseille and RT aware of no inner cannulas for pt's current trach.

## 2015-07-28 NOTE — Progress Notes (Signed)
Pt admitted from E.D. via hospital stretcher w/ ventilator, monitor and RN; Pt assisted to hospital bed and attached to unit monitors and O2; Pt introduced to staff and unit, MRSA swab done and 6 cloth CHG bath complete. Upon initial inspection of Pt's back and buttocks, there appeared to be a area of MASD from back of mid thigh to mid buttocks. Pt was cleaned of incontinent stool and moisture barrier cream applied. Also noted were 2 round puncture type wounds, approx 1 1/2cm deep by cotton tipped applicator.Pt's family to be brought to bedside for update soon.

## 2015-07-28 NOTE — Progress Notes (Signed)
Initial Nutrition Assessment  DOCUMENTATION CODES:   Obesity unspecified  INTERVENTION:  Vital HP @ 15 ml/hr + Pro-Stat 41ml x5 Provides: 1360 calories 181.5 g Pro 301 ml free h2o   NUTRITION DIAGNOSIS:   Inadequate oral intake related to inability to eat as evidenced by NPO status.  GOAL:   Provide needs based on ASPEN/SCCM guidelines  MONITOR:   I & O's, Skin, Labs, Vent status  REASON FOR ASSESSMENT:   Consult Enteral/tube feeding initiation and management  ASSESSMENT:   74 year old male with quadriplegia status post fall & C-spine surgery. Patient treated for recent MRSA pneumonia. He was having repeated desaturations while on ventilator sometimes with simple repositioning. Chest x-ray was concerning for persistent pneumonia & patient was transferred to the emergency department for further evaluation.  Consulted for tubefeeding per pt on ventilator.   Patient is currently intubated on ventilator support MV: 9 ml  Temp (24hrs), Avg:98.1 F (36.7 C), Min:96 F (35.6 C), Max:99.5 F (37.5 C)  Per chart, pt is on PEG at home. Unable to find out what formulary they are using there.  Per chart, pt was tolerating t-bar trials for 12 hrs prior to intubation. Also underoging speech therapy.  RD will follow-up with in-person eval 11/14 Diet Order:     Skin:  Wound (see comment) (puncture to anal area.)  Last BM:  07/28/2015  Height:   Ht Readings from Last 1 Encounters:  07/28/15 6\' 1"  (1.854 m)    Weight:   Wt Readings from Last 1 Encounters:  07/28/15 271 lb 2.7 oz (123 kg)    Ideal Body Weight:  83.63 kg  BMI:  Body mass index is 35.78 kg/(m^2).  Estimated Nutritional Needs:   Kcal:  1353 - 1722  Protein:  167 grams  Fluid:  Per MD rec  EDUCATION NEEDS:   No education needs identified at this time  Thomas Zuniga. Thomas Markin, MS, RD LDN After Hours/Weekend Pager [phone]

## 2015-07-28 NOTE — Progress Notes (Signed)
Dr. Melvyn Novas notified that PICC was unable to be re-inserted on right side (pt has permanent pacemaker on left side). No new orders. New peripheral IV will be placed.

## 2015-07-28 NOTE — Progress Notes (Signed)
Called Dr. Nelda Marseille re: DVT left leg from groin to knee, questionable age, orders received

## 2015-07-28 NOTE — Progress Notes (Signed)
Foley catheter removed per MD order. Large clot material noted on tip of catheter. Post removal, moderate amount of bloody drainage, and a few clots noted to be draining from meatus.  Peri care performed. New foley inserted by this RN using sterile technique; witnessed by [facility] RN. Upon insertion, no urine noted in tubing. Attempted to irrigate foley x1. Unable to irrigate. Attempted a second time to irrigate, with some force, able to pass irrigant into catheter. After irrigating, 400cc clear, yellow urine with small clots/sediment returned into drainage bag. Small amount of bloody drainage noted around meatus. (patient is currently on a heparin drip.) Will continue to closely monitor. Richarda Blade RN

## 2015-07-28 NOTE — Plan of Care (Signed)
Dr. Nelda Marseille called re: PICC and pt has perm pacer on left. To take out right PICC and insert new PICC right arm.

## 2015-07-28 NOTE — Progress Notes (Signed)
Pt admitted to room 2S3. Pt was transported on 100% FiO2. Uneventful trip. RN at bedside. Report given to RT prior before transport.

## 2015-07-28 NOTE — Evaluation (Signed)
Clinical/Bedside Swallow Evaluation Patient Details  Name: Thomas Zuniga MRN: [id number] Date of Birth: 02/24/1941  Today's Date: 07/28/2015 Time: SLP Start Time (ACUTE ONLY): 0841 SLP Stop Time (ACUTE ONLY): 0907 SLP Time Calculation (min) (ACUTE ONLY): 26 min  Past Medical History:  Past Medical History  Diagnosis Date  . Quadriplegia ([place])   . PNA (pneumonia)     MRSA  . Diabetes mellitus without complication ([place])   . Atrial fibrillation ([place])   . COPD (chronic obstructive pulmonary disease) ([place])   . MRSA (methicillin resistant Staphylococcus aureus)   . Dyslipidemia   . Hypertension   . Depression   . Neurogenic bladder   . H/O ventricular tachycardia   . Respiratory failure, chronic (HCC)     Hypoxic & on Ventilator S/P Trach  . Rectal fistula    Past Surgical History:  Past Surgical History  Procedure Laterality Date  . Neck surgery    . Tracheostomy    . Peripherally inserted central catheter insertion    . Pacemaker placement     HPI:  74 year old male with quadriplegia status post fall & C-spine surgery. Patient treated for recent MRSA pneumonia. He was having repeated desaturations while on ventilator sometimes with simple repositioning. Chest x-ray was concerning for persistent pneumonia with RLL consolidation.   Assessment / Plan / Recommendation Clinical Impression  SLP administered diagnostic trials of ice chips, which resulted in weak hyolaryngeal movement upon palpation. VS remained stable, but pt reports inability to cough and therefore cannot rule out silent aspiration. Pt's wife arrived upon completion of the evaluation, and reports that he had a swallow study two weeks ago at [place], after which he was kept NPO except for trials of pudding and honey thick liquids with SLP only. Given the above as well as pt's RLL PNA, would maintain NPO status with nutrition and medications through PEG. SLP to follow for dysphagia treatment with consideration of repeat  testing as warranted.   Of note, pt's wife also shares that he has previously used an inline PMSV - MD please provide orders so we can continue this during his acute care stay.    Aspiration Risk  Severe aspiration risk    Diet Recommendation  NPO   Medication Administration: Via alternative means    Other  Recommendations Oral Care Recommendations: Oral care QID   Follow up Recommendations  LTACH    Frequency and Duration min 2x/week  2 weeks       Swallow Study   General HPI: 74 year old male with quadriplegia status post fall & C-spine surgery. Patient treated for recent MRSA pneumonia. He was having repeated desaturations while on ventilator sometimes with simple repositioning. Chest x-ray was concerning for persistent pneumonia with RLL consolidation. Type of Study: Bedside Swallow Evaluation Previous Swallow Assessment: none in chart; per wife, pt had FEES ~2 weeks ago and was kept NPO except for trials of honey thick liquids and pudding with SLP Diet Prior to this Study: NPO;PEG tube Temperature Spikes Noted: No Respiratory Status: Ventilator History of Recent Intubation: No Behavior/Cognition: Alert;Cooperative Oral Cavity Assessment: Within Functional Limits Self-Feeding Abilities: Total assist Patient Positioning: Upright in bed Baseline Vocal Quality: Aphonic (secondary to trach/vent) Volitional Cough: Other (Comment) (unable to elicit)    Oral/Motor/Sensory Function Overall Oral Motor/Sensory Function: Within functional limits   Ice Chips Ice chips: Impaired Presentation: Spoon Pharyngeal Phase Impairments: Decreased hyoid-laryngeal movement   Thin Liquid Thin Liquid: Not tested    Nectar Thick Nectar Thick Liquid: Not tested  Honey Thick Honey Thick Liquid: Not tested   Puree Puree: Not tested   Solid Solid: Not tested      Germain Osgood, M.A. CCC-SLP [phone]  Germain Osgood 07/28/2015,9:24 AM

## 2015-07-28 NOTE — Plan of Care (Signed)
Materials management does not have any size 6 shiley XLTP inner cannulas needed for patient's trach. (OR does not have any). AC called... to check other campuses. Will follow up.

## 2015-07-28 NOTE — Progress Notes (Signed)
PULMONARY / CRITICAL CARE MEDICINE   Name: Thomas Zuniga MRN: [id number] DOB: 05/31/1941    ADMISSION DATE:  07/27/2015 CONSULTATION DATE:  07/28/2015  REFERRING MD :  EDP  CHIEF COMPLAINT:  HCAP  INITIAL PRESENTATION:  74 year old male with quadriplegia status post fall & C-spine surgery. Patient treated for recent MRSA pneumonia. He was having repeated desaturations while on ventilator sometimes with simple repositioning. Chest x-ray was concerning for persistent pneumonia & patient was transferred to the emergency department for further evaluation.  STUDIES:  CTA Chest 07/27/15 (personally reviewed by me) - No PE. Moderate right pleural effusion with peripheral rind of enhancement. Right lower lobe consolidation. Enlarged right hilar & mediastinal lymph nodes. No pericardial effusion.  SIGNIFICANT EVENTS: 11/09 - Foley change 11/13 - Admit to Hospital from [place]: 07/24/15 ABG (on PCV 28/5 0.4 22):  7.42/72/97  06/27/15 CBC: 6.7/8.1/24.8/223 BMP: 138/4.0/96/33/13/0.5/100/8.3 Magnesium: 1.8 LFT: 2.6/6.1/0.7/75/18/29  HISTORY OF PRESENT ILLNESS:  History was obtained from the patient's wife and my review of his medical records from [place]. He is an unfortunate 74 year old male who suffered a fall and underwent subsequent C-spine surgeries ultimately leading to quadriplegia. The patient previously underwent tracheostomy & PEG tube placement. Per the patient's records fairly recently he was treated for an MRSA pneumonia with vancomycin. Exactly when this was is unclear from the records. I can discern that his ventilator settings were increased recently/today and according to his wife this was secondary to intermittent hypoxia. She is unsure whether or not he actually had any fever while at the facility. Most notably the patient seem to desaturate with repositioning at times. On arrival in the emergency department the patient was noted to have an elevated lactic acid as  well as right lower lobe consolidation with associated pleural effusion that appears to have a rind. Previously the patient was tolerating T bar trials for 12 hours at a time. He has continued to have difficulty with swallowing and was undergoing speech therapy. The patient's records also note of a recent GI bleed.  SUBJECTIVE: No events overnight  VITAL SIGNS: Temp:  [96 F (35.6 C)-99.5 F (37.5 C)] 96 F (35.6 C) (11/13 0733) Pulse Rate:  [61-91] 61 (11/13 1000) Resp:  [6-25] 19 (11/13 1000) BP: (89-140)/(47-84) 101/69 mmHg (11/13 1000) SpO2:  [95 %-100 %] 98 % (11/13 1000) FiO2 (%):  [30 %-40 %] 30 % (11/13 0800) Weight:  [123 kg (271 lb 2.7 oz)-127.914 kg (282 lb)] 123 kg (271 lb 2.7 oz) (11/13 0345) HEMODYNAMICS:   VENTILATOR SETTINGS: Vent Mode:  [-] PRVC FiO2 (%):  [30 %-40 %] 30 % Set Rate:  [17 bmp-22 bmp] 17 bmp Vt Set:  [480 mL-640 mL] 480 mL PEEP:  [8 cmH20-10 cmH20] 8 cmH20 Plateau Pressure:  [23 cmH20-30 cmH20] 26 cmH20 INTAKE / OUTPUT:  Intake/Output Summary (Last 24 hours) at 07/28/15 1019 Last data filed at 07/28/15 0900  Gross per 24 hour  Intake   1105 ml  Output   1125 ml  Net    -20 ml   PHYSICAL EXAMINATION: General:  Awake. Alert. No acute distress. Wife at bedside.  Integument:  Warm & dry. Rash in his inguinal areas covered with white powder. Patient did have superficial ulceration and excoriation of the skin on his sacrum & buttocks. Right upper extremity PICC line in place. Lymphatics:  No appreciated cervical or supraclavicular lymphadenoapthy. HEENT:  Moist mucus membranes. Tracheostomy in place. No scleral icterus. PERRL. Cardiovascular:  Regular rate. Bilateral lower extremity edema.  No appreciable JVD.  Pulmonary:  Coarse breath sounds bilaterally on ventilator. Symmetric chest wall rise on ventilator. Bedside thoracic ultrasound fails to show any mobile lung adjacent his right pleural effusion with the effusion appearing a little higher density  than simple fluid. Abdomen: Soft. Normal bowel sounds. Nondistended. PEG tube in place. Musculoskeletal:  No joint deformity or effusion appreciated. Neurological:  CN 2-12 grossly in tact. Quadriplegia. Psychiatric:  Unable to assess as the patient had tracheostomy in place on the ventilator without in-line Passy-Muir valve.  LABS:  CBC  Recent Labs Lab 07/27/15 1630 07/28/15 0334  WBC 12.7* 7.8  HGB 8.9* 9.9*  HCT 29.1* 32.5*  PLT 280 200   Coag's No results for input(s): APTT, INR in the last 168 hours. BMET  Recent Labs Lab 07/27/15 1630 07/28/15 0254  NA 140 139  K 3.4* 4.2  CL 95* 97*  CO2 34* 31  BUN 28* 25*  CREATININE 0.72 0.66  GLUCOSE 218* 90   Electrolytes  Recent Labs Lab 07/27/15 1630 07/28/15 0253 07/28/15 0254 07/28/15 0334  CALCIUM 8.7*  --  8.1*  --   MG  --  1.7  --  2.6*  PHOS  --   --  1.8*  --    Sepsis Markers  Recent Labs Lab 07/27/15 1701 07/27/15 2058 07/27/15 2246 07/28/15 0334  LATICACIDVEN 3.12* 3.32*  --  0.6  PROCALCITON  --   --  2.40  --    ABG  Recent Labs Lab 07/27/15 1706 07/28/15 0440 07/28/15 0630  PHART 7.556* 7.626* 7.490*  PCO2ART 42.3 29.3* 41.3  PO2ART 135.0* 115* 81.9   Liver Enzymes  Recent Labs Lab 07/27/15 1630 07/28/15 0254  AST 38  --   ALT 22  --   ALKPHOS 64  --   BILITOT 0.6  --   ALBUMIN 2.1* 1.9*   Cardiac Enzymes No results for input(s): TROPONINI, PROBNP in the last 168 hours. Glucose  Recent Labs Lab 07/28/15 0404  GLUCAP 108*    Imaging Ct Angio Chest Pe W/cm &/or Wo Cm  07/27/2015  CLINICAL DATA:  Acute onset of severe hypoxia.  Initial encounter. EXAM: CT ANGIOGRAPHY CHEST WITH CONTRAST TECHNIQUE: Multidetector CT imaging of the chest was performed using the standard protocol during bolus administration of intravenous contrast. Multiplanar CT image reconstructions and MIPs were obtained to evaluate the vascular anatomy. CONTRAST:  143mL OMNIPAQUE IOHEXOL 350 MG/ML  SOLN COMPARISON:  Chest radiograph performed earlier today at 4:35 p.m. FINDINGS: There is no evidence of pulmonary embolus. Evaluation for pulmonary embolus is suboptimal in areas of airspace consolidation. There is a complex small to moderate right-sided pleural effusion, with a peripheral rind of enhancement, raising concern for evolving empyema. Underlying dense partial consolidation of the right lower lobe is noted. There is partial opacification of the bronchus to the right lower lobe. This is suspicious for aspiration pneumonia. Mildly prominent right hilar nodes are seen, measuring up to 1.1 cm in short axis, and a 1.4 cm periaortic node is seen. A 1.9 cm right paratracheal node is noted. Underlying right infrahilar mass cannot be excluded. Minimal left-sided atelectasis is noted.  No pneumothorax is seen. A left-sided chest pacemaker, with a single lead ending at the right ventricle. Diffuse coronary artery calcifications are seen. Biatrial enlargement is noted. There is mild prominence of the proximal aortic arch, measuring up to 3.9 cm. A tracheostomy tube is noted. No axillary lymphadenopathy is seen. The visualized portions of the thyroid gland are unremarkable  in appearance. The visualized portions of the liver and spleen are unremarkable. The visualized portions of the pancreas, gallbladder, stomach and adrenal glands are within normal limits. Left-sided perinephric stranding is noted. No acute osseous abnormalities are seen. Cervical fusion hardware is partially imaged. Anterior bridging osteophytes are noted along the thoracic spine. Review of the MIP images confirms the above findings. IMPRESSION: 1. No evidence of pulmonary embolus. 2. Complex small to moderate right-sided pleural effusion, with a peripheral rind of enhancement, concerning for evolving empyema. Underlying dense partial consolidation of the right lower lung lobe, with partial opacification of the associated bronchus, suspicious for  aspiration pneumonia. Diagnostic thoracentesis is suggested for further evaluation, given the complexity of the pleural effusion. This would also help to exclude underlying malignancy, given visualized lymphadenopathy. 3. Prominent right hilar and mediastinal nodes seen, measuring up to 1.9 cm in short axis. 4. Mild prominence of the proximal aortic arch, measuring up to 3.9 cm in diameter. 5. Diffuse coronary artery calcifications seen. Electronically Signed   By: Garald Balding M.D.   On: 07/27/2015 22:54   Dg Chest Port 1 View  07/27/2015  CLINICAL DATA:  Respiratory distress, recently diagnosed with pneumonia. Desaturations. EXAM: PORTABLE CHEST 1 VIEW COMPARISON:  None. FINDINGS: Heart is enlarged. Mediastinal diameter is also somewhat prominent but this is likely accentuated by the supine patient positioning. Left chest wall pacemaker/AICD in place. There is prominence of the right perihilar shadow, of uncertain etiology. Probable small right pleural effusion. Perhaps mild interstitial edema. Right-sided PICC line adequately positioned with tip in the upper SVC. Anterior cervical fusion hardware noted within the lower cervical spine. Old healed fracture of the left clavicle. No acute osseous abnormality seen. IMPRESSION: 1. Cardiomegaly. 2. Prominence of the right perihilar shadow may represent associated central pulmonary vascular congestion related to mild congestive heart failure/volume overload. Perihilar mass or pneumonia cannot be excluded on this single exam. 3. Slight prominence of the mediastinal diameter likely accentuated by the supine patient positioning. If any chest pain, would recommend chest CT angiogram for more definitive characterization. 4. Small right pleural effusion. 5. Probable mild bilateral interstitial edema. Electronically Signed   By: Franki Cabot M.D.   On: 07/27/2015 17:18   ASSESSMENT / PLAN:  PULMONARY Shiley #6 Cuffed Prox XLT Trach (from outside) A: Acute on  chronic hypoxic respiratory failure - secondary to HCAP. Previously doing 12hr T bar trials. Chronic hypercarbic respiratory failure HCAP - recently treated with Vancomycin for MRSA pneumonia Right Pleural Effusion - trapped lung versus parapneumonic effusion H/O COPD   P:   Switch to PRVC 8cc/kg TV. ABG this AM. See ID Section. Will defer to Monday morning MD to see if CVTS needs to see, in my opinion this is a chronic process and patient has not evidence of infection. Duonebs q6hr. Hold weaning for now.  CARDIOVASCULAR RUE PICC (from outside) A:  No acute issues Chronic hypotension - treated with Midodrine bid H/O Atrial fibrillation H/O HTN H/O Ventricular tachycardia H/o Dyslipidemia  P:  Monitor on telemetry. Continue midodrine. Lopressor 12.5 mg via tube bid. Checking Digoxin level. Continue Digoxin via tube.  RENAL A:   Hypokalemia Lactic Acidosis Chronic foley Neurogenic bladder  P:   Foley changed 11/9 per outside records. Monitor UOP with foley. Trend renal function with daily BUN/Creatinine. Repeat electrolyte panel in AM. Replace Phos.  GASTROINTESTINAL A:   Dysphagia Recent GI Bleed - per outside records.  P:   Consult Speech. Protonix IV q12hr. Nutrition to consult for  TF. Free H2O 200cc via tube q4hr.  HEMATOLOGIC A:   Anemia - Normochronic & normocytic Leukocytosis - Likely due to Sepsis from Pneumonia  P:  Checking lower extremity US given edema. SCDs. Holding on chemical DVT prophylaxis given recent reported GI bleed. Checking serum B12, FA, MMA, Iron, Ferritin, & TIBC.  INFECTIOUS A:   Sepsis HCAP - h/o recent MRSA pneumonia Possible Parapneumonic Effusion Sacral Decub Oral Candidiasis & Rash  P:   Procalcitonin 2.4 CT Surgery consult to be considered on Monday if needed.  Wound Consult.  BCx2 11/12>>> UC 11/12>>> Tracheal Asp 11/12>>>  Abx:  Vancomycin, start date 11/12, day 1/x Zosyn, start date 11/12, day  1/x Diflucan via tube & Nystatin Powder 11/2, day 12/x  ENDOCRINE A:   H/O DM Type II     P:   Accu-Checks q4hr Low Dose SSI  NEUROLOGIC A:   Quadriplegia H/O Depression  P:   Continue Lexapro, Neurontin, & Remeron Speech Consult  FAMILY  - Updates: Patient's wife at bedside this morning and updated by myself.  - Inter-disciplinary family meet or Palliative Care meeting due by:  11/20  The patient is critically ill with multiple organ systems failure and requires high complexity decision making for assessment and support, frequent evaluation and titration of therapies, application of advanced monitoring technologies and extensive interpretation of multiple databases.   Critical Care Time devoted to patient care services described in this note is  35  Minutes. This time reflects time of care of this signee Dr Jennet Maduro. This critical care time does not reflect procedure time, or teaching time or supervisory time of PA/NP/Med student/Med Resident etc but could involve care discussion time.  Rush Farmer, M.D. [facility] Pulmonary/Critical Care Medicine. Pager: [phone]. After hours pager: [phone].  07/28/2015, 10:19 AM

## 2015-07-28 NOTE — Plan of Care (Signed)
Critical blood culture (gram pos cocci) called to Dr. Nelda Marseille.

## 2015-07-28 NOTE — Plan of Care (Signed)
Talked with IV team re:  PICC (needing new one at same site), also needing PIV so heparin will not be interrupted.

## 2015-07-28 NOTE — ED Notes (Signed)
Report attempted 

## 2015-07-28 NOTE — Progress Notes (Signed)
VASCULAR LAB PRELIMINARY  PRELIMINARY  PRELIMINARY  PRELIMINARY  Bilateral lower extremity venous duplex completed.    Preliminary report:  There is no DVT or SVT noted in the right lower extremity. There is partially occlusive, age indeterminate DVT noted in the left common femoral, femoral, and popliteal veins.    Kariem Wolfson, RVT 07/28/2015, 12:33 PM

## 2015-07-28 NOTE — Progress Notes (Addendum)
ANTICOAGULATION CONSULT NOTE - Initial Consult  Pharmacy Consult for heparin Indication: DVT  No Known Allergies  Patient Measurements: Height: 6\' 1"  (185.4 cm) Weight: 271 lb 2.7 oz (123 kg) IBW/kg (Calculated) : 79.9 Heparin Dosing Weight: 107 kg  Vital Signs: Temp: 96 F (35.6 C) (11/13 0733) Temp Source: Oral (11/13 0733) BP: 104/62 mmHg (11/13 1200) Pulse Rate: 76 (11/13 1200)  Labs:  Recent Labs  07/27/15 1630 07/28/15 0254 07/28/15 0334  HGB 8.9*  --  9.9*  HCT 29.1*  --  32.5*  PLT 280  --  200  CREATININE 0.72 0.66  --     Estimated Creatinine Clearance: 114.6 mL/min (by C-G formula based on Cr of 0.66).   Medical History: Past Medical History  Diagnosis Date  . Quadriplegia ([place])   . PNA (pneumonia)     MRSA  . Diabetes mellitus without complication ([place])   . Atrial fibrillation ([place])   . COPD (chronic obstructive pulmonary disease) ([place])   . MRSA (methicillin resistant Staphylococcus aureus)   . Dyslipidemia   . Hypertension   . Depression   . Neurogenic bladder   . H/O ventricular tachycardia   . Respiratory failure, chronic (HCC)     Hypoxic & on Ventilator S/P Trach  . Rectal fistula     Medications:  Prescriptions prior to admission  Medication Sig Dispense Refill Last Dose  . acetaminophen (TYLENOL) 325 MG tablet Take 650 mg by mouth every 6 (six) hours as needed for moderate pain.   07/26/2015 at Unknown time  . ceFEPIme 1 g in dextrose 5 % 50 mL Inject 1 g into the vein every 12 (twelve) hours.   not started  . chlorhexidine (PERIDEX) 0.12 % solution Use as directed 15 mLs in the mouth or throat 2 (two) times daily.   07/27/2015 at Unknown time  . clotrimazole-betamethasone (LOTRISONE) cream Apply 1 application topically 2 (two) times daily.   07/27/2015 at Unknown time  . digoxin (LANOXIN) 0.125 MG tablet Give 0.125 mg by tube daily.   07/27/2015 at Unknown time  . escitalopram (LEXAPRO) 20 MG tablet Take 20 mg by mouth daily.    07/27/2015 at Unknown time  . ferrous sulfate 300 (60 FE) MG/5ML syrup Take 300 mg by mouth 2 (two) times daily with a meal.   07/27/2015 at Unknown time  . fluconazole (DIFLUCAN) 200 MG tablet Give 200 mg by tube daily.   Completed Course at Unknown time  . folic acid (FOLVITE) 1 MG tablet Give 1 mg by tube daily.   07/27/2015 at Unknown time  . gabapentin (NEURONTIN) 800 MG tablet Take 800 mg by mouth 2 (two) times daily.   07/27/2015 at Unknown time  . insulin aspart (NOVOLOG) 100 UNIT/ML injection Inject 0-10 Units into the skin every 6 (six) hours. Sliding scale   07/27/2015 at Unknown time  . ipratropium-albuterol (DUONEB) 0.5-2.5 (3) MG/3ML SOLN Take 3 mLs by nebulization every 4 (four) hours as needed (for SOB).   not started  . lidocaine (XYLOCAINE) 2 % solution Inhale 2 mLs into the lungs every 4 (four) hours as needed (use with nebulizer for cough).   not started  . metoprolol tartrate (LOPRESSOR) 25 MG tablet Take 12.5 mg by mouth 2 (two) times daily.   07/27/2015 at 0900  . midodrine (PROAMATINE) 2.5 MG tablet Take 2.5 mg by mouth 2 (two) times daily with a meal.   07/27/2015 at Unknown time  . mirtazapine (REMERON) 15 MG tablet Give 15 mg by  tube at bedtime.   07/26/2015 at Unknown time  . Multiple Vitamin (MULTIVITAMIN WITH MINERALS) TABS tablet Give 1 tablet by tube daily.   07/27/2015 at Unknown time  . ondansetron (ZOFRAN) 4 MG tablet Take 4 mg by mouth every 6 (six) hours as needed for nausea or vomiting.   Past Week at Unknown time  . pantoprazole (PROTONIX) 40 MG tablet Give 40 mg by tube daily.   07/27/2015 at Unknown time  . Probiotic Product (PROBIOTIC FORMULA) CAPS Take 1 capsule by mouth 2 (two) times daily.   07/27/2015 at Unknown time  . tamsulosin (FLOMAX) 0.4 MG CAPS capsule Give 0.4 mg by tube daily after breakfast.   07/27/2015 at Unknown time  . torsemide (DEMADEX) 10 MG tablet Take 10 mg by mouth daily.   07/27/2015 at Unknown time  . vancomycin 1,000 mg in sodium  chloride 0.9 % 250 mL Inject 1,000 mg into the vein every 12 (twelve) hours. Starting 07/27/15, for 8 days ending 08/03/15   not started  . vitamin B-12 (CYANOCOBALAMIN) 250 MCG tablet Take 250 mcg by mouth daily.   07/27/2015 at Unknown time  . Water For Irrigation, Sterile (FREE WATER) SOLN Place 200 mLs into feeding tube every 4 (four) hours.   07/27/2015 at Unknown time    Assessment: 74 y/o quadriplegic male with trach admitted from [facility] for respiratory distress. CTA chest was neg for PE, however, venous duplex revealed age indeterminate L DVT. Pharmacy consulted to begin IV heparin. Renal function is normal. No bleeding noted, although MD notes state he has a history of recent GI bleed, Hb is low at 9.9, platelets are normal. He did not receive any of the SQ heparin ordered for VTE prophylaxis.  Goal of Therapy:  Heparin level 0.3-0.7 units/ml Monitor platelets by anticoagulation protocol: Yes   Plan:  - Heparin 4000 units IV bolus then infuse at 1800 units/hr - 8 hr heparin level - Daily heparin level and CBC - Monitor for s/sx of bleeding  [facility], [place].D., BCPS Clinical Pharmacist Pager: [phone] 07/28/2015 12:48 PM

## 2015-07-28 NOTE — Progress Notes (Signed)
eLink Physician-Brief Progress Note Patient Name: Edley Oh DOB: 07-10-1941 MRN: [id number]   Date of Service  07/28/2015  HPI/Events of Note  Request for IV fluid. Na+ = 139  eICU Interventions  Will order 0.9 NaCl to run IV at 125 mL/hour.      Intervention Category Minor Interventions: Routine modifications to care plan (e.g. PRN medications for pain, fever)  Sommer,Steven Eugene 07/28/2015, 3:52 AM

## 2015-07-28 NOTE — Progress Notes (Addendum)
Returned unopened bag of Fentanyl to pharmacy.   Error - wrong chart.

## 2015-07-28 NOTE — H&P (Signed)
PULMONARY / CRITICAL CARE MEDICINE   Name: Thomas Zuniga MRN: [id number] DOB: 02-12-1941    ADMISSION DATE:  07/27/2015 CONSULTATION DATE:  07/28/2015  REFERRING MD :  EDP  CHIEF COMPLAINT:  HCAP  INITIAL PRESENTATION:  74 year old male with quadriplegia status post fall & C-spine surgery. Patient treated for recent MRSA pneumonia. He was having repeated desaturations while on ventilator sometimes with simple repositioning. Chest x-ray was concerning for persistent pneumonia & patient was transferred to the emergency department for further evaluation.  STUDIES:  CTA Chest 07/27/15 (personally reviewed by me) - No PE. Moderate right pleural effusion with peripheral rind of enhancement. Right lower lobe consolidation. Enlarged right hilar & mediastinal lymph nodes. No pericardial effusion.  SIGNIFICANT EVENTS: 11/09 - Foley change 11/13 - Admit to Hospital from [facility]: 07/24/15 ABG (on PCV 28/5 0.4 22):  7.42/72/97  06/27/15 CBC: 6.7/8.1/24.8/223 BMP: 138/4.0/96/33/13/0.5/100/8.3 Magnesium: 1.8 LFT: 2.6/6.1/0.7/75/18/29  HISTORY OF PRESENT ILLNESS:  History was obtained from the patient's wife and my review of his medical records from [place]. He is an unfortunate 74 year old male who suffered a fall and underwent subsequent C-spine surgeries ultimately leading to quadriplegia. The patient previously underwent tracheostomy & PEG tube placement. Per the patient's records fairly recently he was treated for an MRSA pneumonia with vancomycin. Exactly when this was is unclear from the records. I can discern that his ventilator settings were increased recently/today and according to his wife this was secondary to intermittent hypoxia. She is unsure whether or not he actually had any fever while at the facility. Most notably the patient seem to desaturate with repositioning at times. On arrival in the emergency department the patient was noted to have an elevated lactic acid as  well as right lower lobe consolidation with associated pleural effusion that appears to have a rind. Previously the patient was tolerating T bar trials for 12 hours at a time. He has continued to have difficulty with swallowing and was undergoing speech therapy. The patient's records also note of a recent GI bleed.  PAST MEDICAL HISTORY :  Past Medical History  Diagnosis Date  . Quadriplegia ([place])   . PNA (pneumonia)     MRSA  . Diabetes mellitus without complication ([place])   . Atrial fibrillation ([place])   . COPD (chronic obstructive pulmonary disease) ([place])   . MRSA (methicillin resistant Staphylococcus aureus)   . Dyslipidemia   . Hypertension   . Depression   . Neurogenic bladder   . H/O ventricular tachycardia   . Respiratory failure, chronic (HCC)     Hypoxic & on Ventilator S/P Trach  . Rectal fistula     PAST SURGICAL HISTORY: Past Surgical History  Procedure Laterality Date  . Neck surgery    . Tracheostomy    . Peripherally inserted central catheter insertion    . Pacemaker placement      Prior to Admission medications   Medication Sig Start Date End Date Taking? Authorizing Provider  acetaminophen (TYLENOL) 325 MG tablet Take 650 mg by mouth every 6 (six) hours as needed for moderate pain.   Yes Historical Provider, MD  ceFEPIme 1 g in dextrose 5 % 50 mL Inject 1 g into the vein every 12 (twelve) hours.    Historical Provider, MD  chlorhexidine (PERIDEX) 0.12 % solution Use as directed 15 mLs in the mouth or throat 2 (two) times daily.   Yes Historical Provider, MD  clotrimazole-betamethasone (LOTRISONE) cream Apply 1 application topically 2 (two) times daily.  Yes Historical Provider, MD  digoxin (LANOXIN) 0.125 MG tablet Give 0.125 mg by tube daily.   Yes Historical Provider, MD  escitalopram (LEXAPRO) 20 MG tablet Take 20 mg by mouth daily.   Yes Historical Provider, MD  ferrous sulfate 300 (60 FE) MG/5ML syrup Take 300 mg by mouth 2 (two) times daily with a meal.    Yes Historical Provider, MD  fluconazole (DIFLUCAN) 200 MG tablet Give 200 mg by tube daily.    Historical Provider, MD  folic acid (FOLVITE) 1 MG tablet Give 1 mg by tube daily.   Yes Historical Provider, MD  gabapentin (NEURONTIN) 800 MG tablet Take 800 mg by mouth 2 (two) times daily.   Yes Historical Provider, MD  insulin aspart (NOVOLOG) 100 UNIT/ML injection Inject 0-10 Units into the skin every 6 (six) hours. Sliding scale   Yes Historical Provider, MD  ipratropium-albuterol (DUONEB) 0.5-2.5 (3) MG/3ML SOLN Take 3 mLs by nebulization every 4 (four) hours as needed (for SOB).    Historical Provider, MD  lidocaine (XYLOCAINE) 2 % solution Inhale 2 mLs into the lungs every 4 (four) hours as needed (use with nebulizer for cough).    Historical Provider, MD  metoprolol tartrate (LOPRESSOR) 25 MG tablet Take 12.5 mg by mouth 2 (two) times daily.   Yes Historical Provider, MD  midodrine (PROAMATINE) 2.5 MG tablet Take 2.5 mg by mouth 2 (two) times daily with a meal.   Yes Historical Provider, MD  mirtazapine (REMERON) 15 MG tablet Give 15 mg by tube at bedtime.   Yes Historical Provider, MD  Multiple Vitamin (MULTIVITAMIN WITH MINERALS) TABS tablet Give 1 tablet by tube daily.   Yes Historical Provider, MD  ondansetron (ZOFRAN) 4 MG tablet Take 4 mg by mouth every 6 (six) hours as needed for nausea or vomiting.   Yes Historical Provider, MD  pantoprazole (PROTONIX) 40 MG tablet Give 40 mg by tube daily.   Yes Historical Provider, MD  Probiotic Product (PROBIOTIC FORMULA) CAPS Take 1 capsule by mouth 2 (two) times daily.   Yes Historical Provider, MD  tamsulosin (FLOMAX) 0.4 MG CAPS capsule Give 0.4 mg by tube daily after breakfast.   Yes Historical Provider, MD  torsemide (DEMADEX) 10 MG tablet Take 10 mg by mouth daily.   Yes Historical Provider, MD  vancomycin 1,000 mg in sodium chloride 0.9 % 250 mL Inject 1,000 mg into the vein every 12 (twelve) hours. Starting 07/27/15, for 8 days ending  08/03/15    Historical Provider, MD  vitamin B-12 (CYANOCOBALAMIN) 250 MCG tablet Take 250 mcg by mouth daily.   Yes Historical Provider, MD  Water For Irrigation, Sterile (FREE WATER) SOLN Place 200 mLs into feeding tube every 4 (four) hours.   Yes Historical Provider, MD   No Known Allergies  FAMILY HISTORY:  Family History  Problem Relation Age of Onset  . Heart disease Mother   . Stroke Father     SOCIAL HISTORY: Social History   Social History  . Marital Status: Married    Spouse Name: N/A  . Number of Children: N/A  . Years of Education: N/A   Social History Main Topics  . Smoking status: Never Smoker   . Smokeless tobacco: Never Used  . Alcohol Use: No  . Drug Use: Yes     Comment: Remotely used marijuana.  . Sexual Activity: Not Asked   Other Topics Concern  . None   Social History Narrative   Married & previously living in [place].  Patient was hospitalized at [facility] in [place], [place], before being transferred to [facility] in [place] and then to [facility] here in [place].    REVIEW OF SYSTEMS:  Unable to assess as the patient had no in-line [employer] valve.  SUBJECTIVE:   VITAL SIGNS: Temp:  [99.5 F (37.5 C)] 99.5 F (37.5 C) (11/12 2002) Pulse Rate:  [62-86] 75 (11/13 0200) Resp:  [16-25] 22 (11/13 0200) BP: (90-140)/(47-83) 108/70 mmHg (11/13 0200) SpO2:  [97 %-100 %] 97 % (11/13 0200) FiO2 (%):  [40 %] 40 % (11/12 2314) Weight:  [127.914 kg (282 lb)] 127.914 kg (282 lb) (11/12 1616) HEMODYNAMICS:   VENTILATOR SETTINGS: Vent Mode:  [-] PCV FiO2 (%):  [40 %] 40 % Set Rate:  [22 bmp] 22 bmp PEEP:  [8 cmH20-10 cmH20] 8 cmH20 Plateau Pressure:  [23 [id number] cmH20] 28 cmH20 INTAKE / OUTPUT:  Intake/Output Summary (Last 24 hours) at 07/28/15 0314 Last data filed at 07/27/15 2058  Gross per 24 hour  Intake    200 ml  Output    550 ml  Net   -350 ml    PHYSICAL EXAMINATION: General:  Awake. Alert. No acute distress. Wife at bedside.   Integument:  Warm & dry. Rash in his inguinal areas covered with white powder. Patient did have superficial ulceration and excoriation of the skin on his sacrum & buttocks. Right upper extremity PICC line in place. Lymphatics:  No appreciated cervical or supraclavicular lymphadenoapthy. HEENT:  Moist mucus membranes. Tracheostomy in place. No scleral icterus. PERRL. Cardiovascular:  Regular rate. Bilateral lower extremity edema. No appreciable JVD.  Pulmonary:  Coarse breath sounds bilaterally on ventilator. Symmetric chest wall rise on ventilator. Bedside thoracic ultrasound fails to show any mobile lung adjacent his right pleural effusion with the effusion appearing a little higher density than simple fluid. Abdomen: Soft. Normal bowel sounds. Nondistended. PEG tube in place. Musculoskeletal:  No joint deformity or effusion appreciated. Neurological:  CN 2-12 grossly in tact. Quadriplegia. Psychiatric:  Unable to assess as the patient had tracheostomy in place on the ventilator without in-line Passy-Muir valve.  LABS:  CBC  Recent Labs Lab 07/27/15 1630  WBC 12.7*  HGB 8.9*  HCT 29.1*  PLT 280   Coag's No results for input(s): APTT, INR in the last 168 hours. BMET  Recent Labs Lab 07/27/15 1630  NA 140  K 3.4*  CL 95*  CO2 34*  BUN 28*  CREATININE 0.72  GLUCOSE 218*   Electrolytes  Recent Labs Lab 07/27/15 1630  CALCIUM 8.7*   Sepsis Markers  Recent Labs Lab 07/27/15 1701 07/27/15 2058  LATICACIDVEN 3.12* 3.32*   ABG  Recent Labs Lab 07/27/15 1706  PHART 7.556*  PCO2ART 42.3  PO2ART 135.0*   Liver Enzymes  Recent Labs Lab 07/27/15 1630  AST 38  ALT 22  ALKPHOS 64  BILITOT 0.6  ALBUMIN 2.1*   Cardiac Enzymes No results for input(s): TROPONINI, PROBNP in the last 168 hours. Glucose No results for input(s): GLUCAP in the last 168 hours.  Imaging Ct Angio Chest Pe W/cm &/or Wo Cm  07/27/2015  CLINICAL DATA:  Acute onset of severe  hypoxia.  Initial encounter. EXAM: CT ANGIOGRAPHY CHEST WITH CONTRAST TECHNIQUE: Multidetector CT imaging of the chest was performed using the standard protocol during bolus administration of intravenous contrast. Multiplanar CT image reconstructions and MIPs were obtained to evaluate the vascular anatomy. CONTRAST:  154mL OMNIPAQUE IOHEXOL 350 MG/ML SOLN COMPARISON:  Chest radiograph performed earlier today  at 4:35 p.m. FINDINGS: There is no evidence of pulmonary embolus. Evaluation for pulmonary embolus is suboptimal in areas of airspace consolidation. There is a complex small to moderate right-sided pleural effusion, with a peripheral rind of enhancement, raising concern for evolving empyema. Underlying dense partial consolidation of the right lower lobe is noted. There is partial opacification of the bronchus to the right lower lobe. This is suspicious for aspiration pneumonia. Mildly prominent right hilar nodes are seen, measuring up to 1.1 cm in short axis, and a 1.4 cm periaortic node is seen. A 1.9 cm right paratracheal node is noted. Underlying right infrahilar mass cannot be excluded. Minimal left-sided atelectasis is noted.  No pneumothorax is seen. A left-sided chest pacemaker, with a single lead ending at the right ventricle. Diffuse coronary artery calcifications are seen. Biatrial enlargement is noted. There is mild prominence of the proximal aortic arch, measuring up to 3.9 cm. A tracheostomy tube is noted. No axillary lymphadenopathy is seen. The visualized portions of the thyroid gland are unremarkable in appearance. The visualized portions of the liver and spleen are unremarkable. The visualized portions of the pancreas, gallbladder, stomach and adrenal glands are within normal limits. Left-sided perinephric stranding is noted. No acute osseous abnormalities are seen. Cervical fusion hardware is partially imaged. Anterior bridging osteophytes are noted along the thoracic spine. Review of the MIP  images confirms the above findings. IMPRESSION: 1. No evidence of pulmonary embolus. 2. Complex small to moderate right-sided pleural effusion, with a peripheral rind of enhancement, concerning for evolving empyema. Underlying dense partial consolidation of the right lower lung lobe, with partial opacification of the associated bronchus, suspicious for aspiration pneumonia. Diagnostic thoracentesis is suggested for further evaluation, given the complexity of the pleural effusion. This would also help to exclude underlying malignancy, given visualized lymphadenopathy. 3. Prominent right hilar and mediastinal nodes seen, measuring up to 1.9 cm in short axis. 4. Mild prominence of the proximal aortic arch, measuring up to 3.9 cm in diameter. 5. Diffuse coronary artery calcifications seen. Electronically Signed   By: Garald Balding M.D.   On: 07/27/2015 22:54   Dg Chest Port 1 View  07/27/2015  CLINICAL DATA:  Respiratory distress, recently diagnosed with pneumonia. Desaturations. EXAM: PORTABLE CHEST 1 VIEW COMPARISON:  None. FINDINGS: Heart is enlarged. Mediastinal diameter is also somewhat prominent but this is likely accentuated by the supine patient positioning. Left chest wall pacemaker/AICD in place. There is prominence of the right perihilar shadow, of uncertain etiology. Probable small right pleural effusion. Perhaps mild interstitial edema. Right-sided PICC line adequately positioned with tip in the upper SVC. Anterior cervical fusion hardware noted within the lower cervical spine. Old healed fracture of the left clavicle. No acute osseous abnormality seen. IMPRESSION: 1. Cardiomegaly. 2. Prominence of the right perihilar shadow may represent associated central pulmonary vascular congestion related to mild congestive heart failure/volume overload. Perihilar mass or pneumonia cannot be excluded on this single exam. 3. Slight prominence of the mediastinal diameter likely accentuated by the supine patient  positioning. If any chest pain, would recommend chest CT angiogram for more definitive characterization. 4. Small right pleural effusion. 5. Probable mild bilateral interstitial edema. Electronically Signed   By: Franki Cabot M.D.   On: 07/27/2015 17:18     ASSESSMENT / PLAN:  PULMONARY [place] #6 Cuffed Prox XLT Trach (from outside) A: Acute on chronic hypoxic respiratory failure - secondary to HCAP. Previously doing 12hr T bar trials. Chronic hypercarbic respiratory failure HCAP - recently treated with Vancomycin  for MRSA pneumonia Right Pleural Effusion - trapped lung versus parapneumonic effusion H/O COPD   P:   Switch to PRVC 8cc/kg TV ABG this AM See ID Section Plan for CT Surgery evaluation in the morning Duonebs q6hr  CARDIOVASCULAR RUE PICC (from outside) A:  No acute issues Chronic hypotension - treated with Midodrine bid H/O Atrial fibrillation H/O HTN H/O Ventricular tachycardia H/o Dyslipidemia  P:  Monitor on telemetry Continue midodrine Lopressor 12.5mg  via tube bid Checking Digoxin level Continue Digoxin via tube  RENAL A:   Hypokalemia Lactic Acidosis Chronic foley Neurogenic bladder  P:   Foley changed 11/9 per outside records Monitor UOP with foley Trend renal function with daily BUN/Creatinine Repeat electrolyte panel in AM along with Magnesium Repeat Lactic Acid with AM labs  GASTROINTESTINAL A:   Dysphagia Recent GI Bleed - per outside records.  P:   Consult Speech Protonix IV q12hr Consult to dietician for tube feeding recommendations Free H2O 200cc via tube q4hr  HEMATOLOGIC A:   Anemia - Normochronic & normocytic Leukocytosis - Likely due to Sepsis from Pneumonia  P:  Checking lower extremity US given edema SCDs Holding on chemical DVT prophylaxis given recent reported GI bleed Checking serum B12, FA, MMA, Iron, Ferritin, & TIBC  INFECTIOUS A:   Sepsis HCAP - h/o recent MRSA pneumonia Possible Parapneumonic  Effusion Sacral Decub Oral Candidiasis & Rash  P:   Procalcitonin algorithm CT Surgery consult in AM for possible VATS  Wound Consult  BCx2 11/12>>> UC 11/12>>> Tracheal Asp 11/12>>>  Abx:  Vancomycin, start date 11/12, day 1/x Zosyn, start date 11/12, day 1/x Diflucan via tube & Nystatin Powder 11/2, day 12/x  ENDOCRINE A:   H/O DM Type II     P:   Accu-Checks q4hr Low Dose SSI  NEUROLOGIC A:   Quadriplegia H/O Depression  P:   Continue Lexapro, Neurontin, & Remeron Speech Consult  FAMILY  - Updates: Patient's wife at bedside this morning and updated by myself.  - Inter-disciplinary family meet or Palliative Care meeting due by:  11/20   TODAY'S SUMMARY: Unfortunate 74 year old Caucasian male with quadriplegia presenting with healthcare associated pneumonia with possibly represents a continuation of his recent MRSA pneumonia. Patient does appear to have a parapneumonic effusion with pleural rind. He'll likely benefit from evaluation by cardiothoracic surgery. Continue broad-spectrum antibiotics for now.   I have spent a total of 60 minutes obtaining history from the patient's wife at bedside, reviewing the patient's records from his LTAC facility, reviewing his electronic medical record here, & caring for the patient this morning.  Sonia Baller Ashok Cordia, M.D. [facility] Pulmonary & Critical Care Pager:  [phone] After 3pm or if no response, call [phone] 07/28/2015, 3:14 AM

## 2015-07-28 NOTE — Plan of Care (Signed)
Problem: Phase I Progression Outcomes Goal: Initial discharge plan identified Outcome: Completed/Met Date Met:  07/28/15 from [facility] Goal: Voiding-avoid urinary catheter unless indicated Outcome: Not Applicable Date Met:  57/90/38 Quad with indwelling foley d/t neuorgenic bladder

## 2015-07-28 NOTE — Progress Notes (Addendum)
[facility] Progress Note Patient Name: Thomas Zuniga DOB: 07/03/1941 MRN: [id number]   Date of Service  07/28/2015  HPI/Events of Note  ABG on 40%/PRVC 22/TV 640/P 8 = 7.62/29/115  eICU Interventions  Will order: 1. Decrease TV to 6 cc/kg (480 mL). 2. Decrease rate to 17. 3. ABG at  6:30 AM.     Intervention Category Major Interventions: Respiratory failure - evaluation and management  [name] 07/28/2015, 5:03 AM

## 2015-07-28 NOTE — Progress Notes (Signed)
Attempted to irrigate foley catheter with 20cc normal saline. Irrigated x 1 via blue side port. Flushed smoothly, but unable to draw back irrigant. Removed red seal, and attempted irrigation through the foley itself with 20cc normal saline x2. Flushed ok, unable to draw back irrigant. Dr Melvyn Novas made aware. Order received to remove and replace foley. Will continue to closely monitor. Richarda Blade RN

## 2015-07-28 NOTE — Progress Notes (Signed)
Peripherally Inserted Central Catheter/Midline Placement  The IV Nurse has discussed with the patient and/or persons authorized to consent for the patient, the purpose of this procedure and the potential benefits and risks involved with this procedure.  The benefits include less needle sticks, lab draws from the catheter and patient may be discharged home with the catheter.  Risks include, but not limited to, infection, bleeding, blood clot (thrombus formation), and puncture of an artery; nerve damage and irregular heat beat.  Alternatives to this procedure were also discussed.  Consent signed by wife.  PICC/Midline Placement Documentation  Old PICC removed as order and attempt to reinsert PICC in new site (right cephalic vein) x 3.  Obtain access to vein each time with good blood flow but unable to thread guide wire more than 6 - 8 cm.  Unable to thread PICC in vein.  PIV obtain for access tonightt.  If PICC still desired may consider IR for placement.       Gordan Payment 07/28/2015, 6:57 PM

## 2015-07-29 DIAGNOSIS — R7881 Bacteremia: Secondary | ICD-10-CM

## 2015-07-29 DIAGNOSIS — T83511D Infection and inflammatory reaction due to indwelling urethral catheter, subsequent encounter: Secondary | ICD-10-CM

## 2015-07-29 LAB — CBC
HCT: 25.4 % — ABNORMAL LOW (ref 39.0–52.0)
HEMATOCRIT: 27.1 % — AB (ref 39.0–52.0)
Hemoglobin: 7.4 g/dL — ABNORMAL LOW (ref 13.0–17.0)
Hemoglobin: 8.3 g/dL — ABNORMAL LOW (ref 13.0–17.0)
MCH: 26.7 pg (ref 26.0–34.0)
MCH: 27.9 pg (ref 26.0–34.0)
MCHC: 29.1 g/dL — AB (ref 30.0–36.0)
MCHC: 30.6 g/dL (ref 30.0–36.0)
MCV: 91.7 fL (ref 78.0–100.0)
MCV: 90.9 fL (ref 78.0–100.0)
PLATELETS: 281 10*3/uL (ref 150–400)
Platelets: 330 10*3/uL (ref 150–400)
RBC: 2.77 MIL/uL — ABNORMAL LOW (ref 4.22–5.81)
RBC: 2.98 MIL/uL — AB (ref 4.22–5.81)
RDW: 17.2 % — AB (ref 11.5–15.5)
RDW: 17.3 % — ABNORMAL HIGH (ref 11.5–15.5)
WBC: 10.7 10*3/uL — ABNORMAL HIGH (ref 4.0–10.5)
WBC: 13.1 10*3/uL — AB (ref 4.0–10.5)

## 2015-07-29 LAB — BASIC METABOLIC PANEL
ANION GAP: 10 (ref 5–15)
ANION GAP: 13 (ref 5–15)
BUN: 17 mg/dL (ref 6–20)
BUN: 18 mg/dL (ref 6–20)
CALCIUM: 8.4 mg/dL — AB (ref 8.9–10.3)
CO2: 29 mmol/L (ref 22–32)
CO2: 28 mmol/L (ref 22–32)
Calcium: 8.2 mg/dL — ABNORMAL LOW (ref 8.9–10.3)
Chloride: 102 mmol/L (ref 101–111)
Chloride: 104 mmol/L (ref 101–111)
Creatinine, Ser: 0.54 mg/dL — ABNORMAL LOW (ref 0.61–1.24)
Creatinine, Ser: 0.55 mg/dL — ABNORMAL LOW (ref 0.61–1.24)
GFR calc Af Amer: >60 mL/min (ref >60)
GFR calc Af Amer: >60 mL/min (ref >60)
GFR calc non Af Amer: >60 mL/min (ref >60)
GLUCOSE: 102 mg/dL — AB (ref 65–99)
Glucose, Bld: 131 mg/dL — ABNORMAL HIGH (ref 65–99)
POTASSIUM: 2.8 mmol/L — AB (ref 3.5–5.1)
Potassium: 2.6 mmol/L — CL (ref 3.5–5.1)
SODIUM: 143 mmol/L (ref 135–145)
Sodium: 143 mmol/L (ref 135–145)

## 2015-07-29 LAB — GLUCOSE, CAPILLARY
GLUCOSE-CAPILLARY: 119 mg/dL — AB (ref 65–99)
GLUCOSE-CAPILLARY: 111 mg/dL — AB (ref 65–99)
GLUCOSE-CAPILLARY: 130 mg/dL — AB (ref 65–99)
GLUCOSE-CAPILLARY: 116 mg/dL — AB (ref 65–99)
Glucose-Capillary: 112 mg/dL — ABNORMAL HIGH (ref 65–99)
Glucose-Capillary: 113 mg/dL — ABNORMAL HIGH (ref 65–99)

## 2015-07-29 LAB — FOLATE RBC
Folate, Hemolysate: >620 ng/mL
Folate, RBC: >2541 ng/mL (ref >498)
Hematocrit: 24.4 % — ABNORMAL LOW (ref 37.5–51.0)

## 2015-07-29 LAB — VANCOMYCIN, TROUGH: VANCOMYCIN TR: 27 ug/mL — AB (ref 10.0–20.0)

## 2015-07-29 LAB — PROCALCITONIN: Procalcitonin: 2.47 ng/mL

## 2015-07-29 LAB — MAGNESIUM: Magnesium: 1.6 mg/dL — ABNORMAL LOW (ref 1.7–2.4)

## 2015-07-29 LAB — HEPARIN LEVEL (UNFRACTIONATED)
Heparin Unfractionated: 0.51 IU/mL (ref 0.30–0.70)
Heparin Unfractionated: 0.55 IU/mL (ref 0.30–0.70)

## 2015-07-29 LAB — PHOSPHORUS: Phosphorus: 4.9 mg/dL — ABNORMAL HIGH (ref 2.5–4.6)

## 2015-07-29 MED ORDER — ACETAMINOPHEN 160 MG/5ML PO SOLN: 650.0000 mg | Freq: Four times a day (QID) | ORAL | Status: DC | PRN

## 2015-07-29 MED ORDER — POTASSIUM CHLORIDE 20 MEQ/15ML (10%) PO SOLN
40.0000 meq | Freq: Two times a day (BID) | ORAL | Status: AC
  Administered 2015-07-29 (×2): 40 meq
  Filled 2015-07-29 (×3): qty 30

## 2015-07-29 MED ORDER — POTASSIUM CHLORIDE 20 MEQ/15ML (10%) PO SOLN
40.0000 meq | Freq: Once | ORAL | Status: AC
  Administered 2015-07-29: 40 meq
  Filled 2015-07-29: qty 30

## 2015-07-29 MED ORDER — MIDODRINE HCL 5 MG PO TABS
5.0000 mg | ORAL_TABLET | Freq: Two times a day (BID) | ORAL | Status: DC
  Administered 2015-07-29 – 2015-08-06 (×16): 5 mg
  Filled 2015-07-29 (×16): qty 1

## 2015-07-29 MED ORDER — WHITE PETROLATUM GEL
Status: DC | PRN
  Administered 2015-07-29: 0.2 via TOPICAL
  Filled 2015-07-29 (×3): qty 28.35

## 2015-07-29 MED ORDER — MAGNESIUM SULFATE 2 GM/50ML IV SOLN
2.0000 g | Freq: Once | INTRAVENOUS | Status: AC
  Administered 2015-07-29: 2 g via INTRAVENOUS
  Filled 2015-07-29: qty 50

## 2015-07-29 MED ORDER — VANCOMYCIN HCL IN DEXTROSE 1-5 GM/200ML-% IV SOLN
1000.0000 mg | Freq: Two times a day (BID) | INTRAVENOUS | Status: DC
Start: 2015-07-30 — End: 2015-08-03
  Administered 2015-07-30 – 2015-08-03 (×9): 1000 mg via INTRAVENOUS
  Filled 2015-07-29 (×9): qty 200

## 2015-07-29 MED ORDER — IPRATROPIUM-ALBUTEROL 0.5-2.5 (3) MG/3ML IN SOLN
3.0000 mL | Freq: Four times a day (QID) | RESPIRATORY_TRACT | Status: DC
  Administered 2015-07-29 – 2015-08-06 (×33): 3 mL via RESPIRATORY_TRACT
  Filled 2015-07-29 (×33): qty 3

## 2015-07-29 NOTE — Progress Notes (Signed)
RT called to assist speech therapy with a passy muir valve trial. Pt's vitals remained stable and pt was able to speak clearly.

## 2015-07-29 NOTE — Progress Notes (Signed)
Utilization Review Completed.  

## 2015-07-29 NOTE — Consult Note (Signed)
WOC consulted for sacral/buttock wounds.  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  These have not been confirmed by any type of diagnostics.  The nurse confirms that he has MASD (moisture associated skin damage) related to loose stools but the skin is not broken, just reddened from incontinence. Currently using Gerhardt's butt cream for MASD.  We discussed the use of a fecal containment pouch to contain stool and drainage to protect skin. Bedside nurse will evaluate for the use of fecal containment pouch at her next assessment.  May not be feasible if the patient is hairy or if the skin is weeping near the site.   Discussed POC with patient and bedside nurse.  Re consult if needed, will not follow at this time. Thanks  Marjorie Deprey Kellogg, [place] [phone])

## 2015-07-29 NOTE — Progress Notes (Signed)
ANTICOAGULATION CONSULT NOTE - Follow Up Consult  Pharmacy Consult for Heparin Indication: DVT  No Known Allergies  Patient Measurements: Height: 6\' 1"  (185.4 cm) Weight: 271 lb 2.7 oz (123 kg) IBW/kg (Calculated) : 79.9 Heparin Dosing Weight: 106  Vital Signs: Temp: 98.6 F (37 C) (11/14 1252) Temp Source: Axillary (11/14 1252) BP: 86/67 mmHg (11/14 1202) Pulse Rate: 81 (11/14 1202)  Labs:  Recent Labs  07/28/15 0254 07/28/15 0334 07/28/15 0451 07/28/15 2345 07/29/15 0157 07/29/15 1200  HGB  --  9.9*  --  8.3*  --  7.4*  HCT  --  32.5* 24.4* 27.1*  --  25.4*  PLT  --  200  --  330  --  281  HEPARINUNFRC  --   --   --  0.55  --  0.51  CREATININE 0.66  --   --  0.54* 0.55*  --     Estimated Creatinine Clearance: 114.6 mL/min (by C-G formula based on Cr of 0.55).   Medications:  Scheduled:  . antiseptic oral rinse  7 mL Mouth Rinse QID  . chlorhexidine gluconate  15 mL Mouth/Throat BID  . Chlorhexidine Gluconate Cloth  6 each Topical Q0600  . digoxin  0.125 mg Per Tube Daily  . escitalopram  20 mg Per Tube Daily  . feeding supplement (PRO-STAT SUGAR FREE 64)  60 mL Per Tube 5 X Daily  . feeding supplement (VITAL HIGH PROTEIN)  1,000 mL Per Tube Q24H  . fluconazole  200 mg Per Tube Daily  . folic acid  1 mg Per Tube Daily  . free water  200 mL Per Tube Q4H  . gabapentin  800 mg Per Tube BID  . insulin aspart  0-9 Units Subcutaneous 6 times per day  . ipratropium-albuterol  3 mL Nebulization QID  . metoprolol tartrate  12.5 mg Per Tube BID  . midodrine  5 mg Per Tube BID WC  . mirtazapine  15 mg Per Tube QHS  . mupirocin ointment  1 application Nasal BID  . nystatin   Topical TID  . pantoprazole (PROTONIX) IV  40 mg Intravenous Q12H  . piperacillin-tazobactam (ZOSYN)  IV  3.375 g Intravenous Q8H  . vancomycin  1,500 mg Intravenous Q12H  . vitamin B-12  250 mcg Per Tube Daily   Infusions:  . sodium chloride 75 mL/hr at 07/29/15 1235  . heparin 1,800  Units/hr (07/29/15 1315)    Assessment: 74 yo M started on heparin for LLE DVT.  Heparin is therapeutic on 1800 units/hr.  Hgb is trending down but no active bleeding noted.  Will monitor closely since pt has hx of recent GIB and anemia.  Goal of Therapy:  Heparin level 0.3-0.7 units/ml Monitor platelets by anticoagulation protocol: Yes   Plan:  Continue heparin at 1800 units/hr Daily heparin level and CBC Follow up plans for long term anticoagulation  [employer], Pharm.D., BCPS Clinical Pharmacist Pager [phone] 07/29/2015 1:48 PM

## 2015-07-29 NOTE — Progress Notes (Signed)
[facility] TEAM 1 - Stepdown/ICU TEAM PROGRESS NOTE  Thomas Zuniga [id number] DOB: August 05, 1941 DOA: 07/27/2015 PCP: Verneita Griffes, MD  Admit HPI / Brief Narrative: 74 year old male with quadriplegia status post fall & C-spine surgery. Patient treated for recent MRSA pneumonia. He was having repeated desaturations while on ventilator at [facility], sometimes with simple repositioning. Chest x-ray was concerning for persistent pneumonia & patient was transferred to the ED at [facility] for further evaluation.  Significant Events: 11/09 - Foley change 11/12 - CTA Chest - No PE. Moderate right pleural effusion with peripheral rind of enhancement. Right lower lobe consolidation. Enlarged right hilar & mediastinal lymph nodes. No pericardial effusion. 11/13 - Admit to Hospital from [place] 11/13 - venous duplex - extensive L LE DVT  HPI/Subjective: The patient is more lethargic than his baseline per his Thomas Zuniga but he will open his eyes and will answer simple questions.  He denies any pain or respiratory distress.  He appears comfortable.  Assessment/Plan:  Acute on chronic hypoxic respiratory failure w/ chronic hypercarbic respiratory failure Ventilator management per PCCM  Sepsis secondary to HCAP v/s UTI v/s other  recently treated with Vancomycin for MRSA pneumonia - continue broad-spectrum antibiotic and follow culture data  ?UTI  UA abnormal, but not surprising given chronic foley - follow culture   1/2 blood cx positive Gram+ cocci  Suspicious for contamination - follow-up speciation and sensitivities - appropriately covered at the present time  Right Pleural Effusion trapped lung versus parapneumonic effusion - PCCM to consider TCTS evaluation   Trach dependent  Previously doing 12hr T bar trials - vent and trach care per PCCM  COPD No evidence of acute exacerbation this time  Chronic hypotension treated with Midodrine  L LE DVT (groin to knee) Noted on duplex - unclear  chronicity - IV heparin continues  H/O Atrial fibrillation Normal sinus rhythm presently  H/O Ventricular tachycardia  Dyslipidemia  Hypokalemia Replace and follow  Hypomagnesemia  Replace and follow  Chronic foley due to Neurogenic bladder Foley changed 11/9 per outside records - changed again 11/13 due to clogging   Chronic Dysphagia - PEG dependent  SLP evaluation pending - continue to utilize PEG for meds and feeds at this time  Anemia - normocytic No evidence of significant acute blood loss - follow trend  DM2 CBG well controlled at this time  Quadriplegia   Sacral decub WOC to see  Obesity - Body mass index is 35.78 kg/(m^2).   MRSA screen +  Code Status: FULL Family Communication: Spoke with Thomas Zuniga at bedside Disposition Plan: SDU  Consultants: PCCM  Antibiotics: Vanc 11/12 > Zosyn 11/12 >  DVT prophylaxis: IV Heparin   Objective: Blood pressure 90/61, pulse 77, temperature 97.4 F (36.3 C), temperature source Axillary, resp. rate 18, height 6\' 1"  (1.854 m), weight 123 kg (271 lb 2.7 oz), SpO2 100 %.  Intake/Output Summary (Last 24 hours) at 07/29/15 [id number] Last data filed at 07/29/15 0700  Gross per 24 hour  Intake   5431 ml  Output   1417 ml  Net   4014 ml   Exam: General: No acute respiratory distress on vent Lungs: Poor air movement bilateral bases - no wheeze Cardiovascular: Regular rate and rhythm without murmur gallop or rub normal S1 and S2 Abdomen: Nontender, nondistended, soft, bowel sounds positive, no rebound, no ascites, no appreciable mass - PEG insertion clean and dry Extremities: No significant cyanosis, clubbing;  1+ edema bilateral lower extremities  Data Reviewed: Basic Metabolic Panel:  Recent Labs  Lab 07/27/15 1630 07/28/15 0253 07/28/15 0254 07/28/15 0334 07/28/15 2345 07/29/15 0157  NA 140  --  139  --  143 143  K 3.4*  --  4.2  --  2.6* 2.8*  CL 95*  --  97*  --  102 104  CO2 34*  --  31  --  28 29  GLUCOSE  218*  --  90  --  102* 131*  BUN 28*  --  25*  --  18 17  CREATININE 0.72  --  0.66  --  0.54* 0.55*  CALCIUM 8.7*  --  8.1*  --  8.4* 8.2*  MG  --  1.7  --  2.6* 1.6*  --   PHOS  --   --  1.8*  --  4.9*  --     CBC:  Recent Labs Lab 07/27/15 1630 07/28/15 0334 07/28/15 2345  WBC 12.7* 7.8 13.1*  NEUTROABS 11.4* 5.7  --   HGB 8.9* 9.9* 8.3*  HCT 29.1* 32.5* 27.1*  MCV 89.0 100.0 90.9  PLT 280 200 330    Liver Function Tests:  Recent Labs Lab 07/27/15 1630 07/28/15 0254  AST 38  --   ALT 22  --   ALKPHOS 64  --   BILITOT 0.6  --   PROT 7.2  --   ALBUMIN 2.1* 1.9*   CBG:  Recent Labs Lab 07/28/15 1655 07/28/15 1945 07/29/15 0001 07/29/15 0407 07/29/15 0714  GLUCAP 125* 110* 113* 119* 111*    Recent Results (from the past 240 hour(s))  Blood Culture (routine x 2)     Status: None (Preliminary result)   Collection Time: 07/27/15  4:30 PM  Result Value Ref Range Status   Specimen Description BLOOD LEFT HAND  Final   Special Requests BOTTLES DRAWN AEROBIC AND ANAEROBIC 5CC  Final   Culture NO GROWTH < 24 HOURS  Final   Report Status PENDING  Incomplete  Blood Culture (routine x 2)     Status: None (Preliminary result)   Collection Time: 07/27/15  4:50 PM  Result Value Ref Range Status   Specimen Description BLOOD LEFT HAND  Final   Special Requests IN PEDIATRIC BOTTLE 2CC  Final   Culture  Setup Time   Final    GRAM POSITIVE COCCI IN CLUSTERS AEROBIC BOTTLE ONLY CRITICAL RESULT CALLED TO, READ BACK BY AND VERIFIED WITH: P KEATON,RN AT [id number] 07/28/15 BY L BENFIELD    Culture GRAM POSITIVE COCCI  Final   Report Status PENDING  Incomplete  Urine culture     Status: None (Preliminary result)   Collection Time: 07/27/15  4:50 PM  Result Value Ref Range Status   Specimen Description URINE, RANDOM  Final   Special Requests NONE  Final   Culture >=100,000 COLONIES/mL GRAM NEGATIVE RODS  Final   Report Status PENDING  Incomplete  Culture, respiratory  (NON-Expectorated)     Status: None (Preliminary result)   Collection Time: 07/27/15  5:28 PM  Result Value Ref Range Status   Specimen Description TRACHEAL ASPIRATE  Final   Special Requests NONE  Final   Gram Stain   Final    ABUNDANT WBC PRESENT,BOTH PMN AND MONONUCLEAR NO SQUAMOUS EPITHELIAL CELLS SEEN NO ORGANISMS SEEN Performed at [facility]    Culture   Final    Culture reincubated for better growth Performed at [facility]    Report Status PENDING  Incomplete  MRSA PCR Screening     Status:  Abnormal   Collection Time: 07/28/15  3:23 AM  Result Value Ref Range Status   MRSA by PCR POSITIVE (A) NEGATIVE Final    Comment:        The GeneXpert MRSA Assay (FDA approved for NASAL specimens only), is one component of a comprehensive MRSA colonization surveillance program. It is not intended to diagnose MRSA infection nor to guide or monitor treatment for MRSA infections. RESULT CALLED TO, READ BACK BY AND VERIFIED WITH: SPOKE TO RB PERRIN,J AT 6:32 ON 07/28/2015 K.PEELE      Studies:   Recent x-ray studies have been reviewed in detail by the Attending Physician  Scheduled Meds:  Scheduled Meds: . antiseptic oral rinse  7 mL Mouth Rinse QID  . chlorhexidine gluconate  15 mL Mouth/Throat BID  . Chlorhexidine Gluconate Cloth  6 each Topical Q0600  . digoxin  0.125 mg Per Tube Daily  . escitalopram  20 mg Per Tube Daily  . feeding supplement (PRO-STAT SUGAR FREE 64)  60 mL Per Tube 5 X Daily  . feeding supplement (VITAL HIGH PROTEIN)  1,000 mL Per Tube Q24H  . fluconazole  200 mg Per Tube Daily  . folic acid  1 mg Per Tube Daily  . free water  200 mL Per Tube Q4H  . gabapentin  800 mg Per Tube BID  . insulin aspart  0-9 Units Subcutaneous 6 times per day  . ipratropium-albuterol  3 mL Nebulization Q6H  . metoprolol tartrate  12.5 mg Per Tube BID  . midodrine  2.5 mg Per Tube BID WC  . mirtazapine  15 mg Per Tube QHS  . mupirocin ointment  1  application Nasal BID  . nystatin   Topical TID  . pantoprazole (PROTONIX) IV  40 mg Intravenous Q12H  . piperacillin-tazobactam (ZOSYN)  IV  3.375 g Intravenous Q8H  . potassium chloride  40 mEq Per Tube BID  . sodium chloride  10-40 mL Intracatheter Q12H  . vancomycin  1,500 mg Intravenous Q12H  . vitamin B-12  250 mcg Per Tube Daily    Time spent on care of this patient: 35 mins   Jadzia Ibsen T , MD   [facility] Office  [phone] Pager - Text Page per Shea Evans as per below:  On-Call/Text Page:      [email or web address]      password [email or web address]  If 7PM-7AM, please contact night-coverage [email or web address] Password [email or web address] 07/29/2015, 9:07 AM   LOS: 1 day

## 2015-07-29 NOTE — Progress Notes (Signed)
Pt transferred to 2C05 with belongings on ventilator. Report given to receiving RN and all questions answered. VSS during transfer.  Pt moved to new bed in new room. Family at bedside.

## 2015-07-29 NOTE — Progress Notes (Signed)
[facility] Progress Note Patient Name: Thomas Zuniga DOB: 02-07-1941 MRN: [id number]   Date of Service  07/29/2015  HPI/Events of Note  k low, rn concerned error Although remainder bmet c/w prior  eICU Interventions  Small k supp while re draw     Intervention Category Major Interventions: Electrolyte abnormality - evaluation and management  Raeven Pint J. 07/29/2015, 1:02 AM

## 2015-07-29 NOTE — Progress Notes (Signed)
CRITICAL VALUE ALERT  Critical value received:  K 2.6  Date of notification:  07/29/15  Time of notification:  0100  Critical value read back: yes  Nurse who received alert:  A. Darcel Smalling RN  MD notified (1st page):  Dr. Titus Mould   Order for replacement and recollect received.

## 2015-07-29 NOTE — Progress Notes (Signed)
CRITICAL VALUE ALERT  Critical value received:  Vanc through 27  Date of notification:  07/29/2015  Time of notification:  2310  Critical value read back: yes  Nurse who received alert:  Eulis Foster  MD notified (1st page):  Baltazar Najjar  Time of first page:  2312  MD notified (2nd page):  Time of second page:  Responding MD:  Baltazar Najjar  Time MD responded:

## 2015-07-29 NOTE — Evaluation (Signed)
Passy-Muir Speaking Valve - Evaluation Patient Details  Name: Thomas Zuniga MRN: [id number] Date of Birth: 05/08/1941  Today's Date: 07/29/2015 Time: 1130-1155 SLP Time Calculation (min) (ACUTE ONLY): 25 min  Past Medical History:  Past Medical History  Diagnosis Date  . Quadriplegia ([place])   . PNA (pneumonia)     MRSA  . Diabetes mellitus without complication ([place])   . Atrial fibrillation ([place])   . COPD (chronic obstructive pulmonary disease) ([place])   . MRSA (methicillin resistant Staphylococcus aureus)   . Dyslipidemia   . Hypertension   . Depression   . Neurogenic bladder   . H/O ventricular tachycardia   . Respiratory failure, chronic ([facility])     Hypoxic & on Ventilator S/P Trach  . Rectal fistula    Past Surgical History:  Past Surgical History  Procedure Laterality Date  . Neck surgery    . Tracheostomy    . Peripherally inserted central catheter insertion    . Pacemaker placement     HPI:  74 year old male with quadriplegia status post fall & C-spine surgery. Patient treated for recent MRSA pneumonia. He was having repeated desaturations while on ventilator sometimes with simple repositioning. Chest x-ray was concerning for persistent pneumonia with RLL consolidation.   Assessment / Plan / Recommendation Clinical Impression  Pt tolerated in-line PMSV during evaluation. Pt's vent was set to [facility], prior to trial Vt was 480 and Pressure PIP was 32. RT provided deep suctioning and deflated cuff, SLP placed in-line PMSV, while RT decreased PEEP to 0 and increased Vt to 680 for Pressure PIP to return to previous level. Initially SLP provided oral suctioning. Pt was able to redirect air to the upper airway, while pt has a weak cough, he has a loud voice and speech was 100% intelligible. Pt participated during conversation with SLP, RT, and his wife. Pt's vital signs remained stable throughout session. Pt tolerated cuff deflation for approximately 20 minutes and PMSV for  approximately 18 minutes. Per pt's wife, pt wore in-line PMSV at [facility] for 45 minutes at a time with SLP. SLP talked to [facility] SLP she has not done in-line PMSV trials in the past week and a half because they don't do in-line PMSV trials when pt's are on pressure assist control. SLP will f/u with continued in-line PMSV treatment.     SLP Assessment  Patient needs continued Speech Lanaguage Pathology Services    Follow Up Recommendations  LTACH    Frequency and Duration min 2x/week  2 weeks    PMSV Trial PMSV was placed for: 18 Able to redirect subglottic air through upper airway: Yes Able to Attain Phonation: Yes Voice Quality: Normal Able to Expectorate Secretions: Yes Level of Secretion Expectoration with PMSV: Oral Breath Support for Phonation: Adequate Intelligibility: Intelligible SpO2 During Trial: 100 %   Tracheostomy Tube       Vent Dependency  Vent Mode: PRVC Set Rate: 17 bmp PEEP: 5 cmH20 FiO2 (%): 30 % Vt Set: 480 mL    Cuff Deflation Trial Tolerated Cuff Deflation: Yes Length of Time for Cuff Deflation Trial: 20 Behavior: Alert;Anxious;Cooperative;Responsive to questions;Loud;Smiling   Lanier Ensign, Student-SLP  Lanier Ensign 07/29/2015, 1:31 PM

## 2015-07-29 NOTE — Progress Notes (Signed)
RT assisted RN with patient transport on vent to 2C05. Patient's vitals remained stable throughout.

## 2015-07-29 NOTE — Progress Notes (Signed)
ANTIBIOTIC CONSULT NOTE  Pharmacy Consult for Vancomycin  Indication: pneumonia  No Known Allergies  Patient Measurements: Height: 6\' 1"  (185.4 cm) Weight: 286 lb 6 oz (129.9 kg) IBW/kg (Calculated) : 79.9 Adjusted Body Weight:   Vital Signs: Temp: 97.9 F (36.6 C) (11/14 2000) Temp Source: Oral (11/14 2000) BP: 112/74 mmHg (11/14 2200) Pulse Rate: 64 (11/14 2200) Intake/Output from previous day: 11/13 0701 - 11/14 0700 In: 5964 [I.V.:3364; NG/GT:1450; IV Piggyback:1150] Out: [id number] [Urine:1515; Stool:2] Intake/Output from this shift: Total I/O In: 824 [I.V.:579; NG/GT:245] Out: -   Labs:  Recent Labs  07/28/15 0254 07/28/15 0334 07/28/15 2345 07/29/15 0157 07/29/15 1200  WBC  --  7.8 13.1*  --  10.7*  HGB  --  9.9* 8.3*  --  7.4*  PLT  --  200 330  --  281  CREATININE 0.66  --  0.54* 0.55*  --    Estimated Creatinine Clearance: 117.9 mL/min (by C-G formula based on Cr of 0.55).  Recent Labs  07/29/15 2147  [facility] 27*     Microbiology: Recent Results (from the past 720 hour(s))  Blood Culture (routine x 2)     Status: None (Preliminary result)   Collection Time: 07/27/15  4:30 PM  Result Value Ref Range Status   Specimen Description BLOOD LEFT HAND  Final   Special Requests BOTTLES DRAWN AEROBIC AND ANAEROBIC 5CC  Final   Culture NO GROWTH 2 DAYS  Final   Report Status PENDING  Incomplete  Blood Culture (routine x 2)     Status: None (Preliminary result)   Collection Time: 07/27/15  4:50 PM  Result Value Ref Range Status   Specimen Description BLOOD LEFT HAND  Final   Special Requests IN PEDIATRIC BOTTLE 2CC  Final   Culture  Setup Time   Final    GRAM POSITIVE COCCI IN CLUSTERS AEROBIC BOTTLE ONLY CRITICAL RESULT CALLED TO, READ BACK BY AND VERIFIED WITH: P KEATON,RN AT [id number] 07/28/15 BY L BENFIELD    Culture STAPHYLOCOCCUS SPECIES (COAGULASE NEGATIVE)  Final   Report Status PENDING  Incomplete  Urine culture     Status: None (Preliminary  result)   Collection Time: 07/27/15  4:50 PM  Result Value Ref Range Status   Specimen Description URINE, RANDOM  Final   Special Requests NONE  Final   Culture   Final    >=100,000 COLONIES/mL GRAM NEGATIVE RODS CULTURE REINCUBATED FOR BETTER GROWTH    Report Status PENDING  Incomplete  Culture, respiratory (NON-Expectorated)     Status: None (Preliminary result)   Collection Time: 07/27/15  5:28 PM  Result Value Ref Range Status   Specimen Description TRACHEAL ASPIRATE  Final   Special Requests NONE  Final   Gram Stain   Final    ABUNDANT WBC PRESENT,BOTH PMN AND MONONUCLEAR NO SQUAMOUS EPITHELIAL CELLS SEEN NO ORGANISMS SEEN Performed at [facility]    Culture   Final    MODERATE STAPHYLOCOCCUS AUREUS Note: RIFAMPIN AND GENTAMICIN SHOULD NOT BE USED AS SINGLE DRUGS FOR TREATMENT OF STAPH INFECTIONS. Performed at [facility]    Report Status PENDING  Incomplete  MRSA PCR Screening     Status: Abnormal   Collection Time: 07/28/15  3:23 AM  Result Value Ref Range Status   MRSA by PCR POSITIVE (A) NEGATIVE Final    Comment:        The GeneXpert MRSA Assay (FDA approved for NASAL specimens only), is one component of a comprehensive MRSA colonization  surveillance program. It is not intended to diagnose MRSA infection nor to guide or monitor treatment for MRSA infections. RESULT CALLED TO, READ BACK BY AND VERIFIED WITH: SPOKE TO RB PERRIN,J AT 6:32 ON 07/28/2015 K.PEELE     Assessment: 74 yo quadriplegic male with MRSE PNA/sepsis for Vancomycin   Goal of Therapy:  Vancomycin trough level 15-20 mcg/ml  Plan:  Change vancomcyin 1 g IV q12h  Phillis Knack, PharmD, BCPS  07/29/2015 11:11 PM

## 2015-07-29 NOTE — Progress Notes (Signed)
Patient transferred to 2C05 via bed from 2S. Respiratory at bedside. No signs of respiratory distress noted at this time. Peg in place and patent. Wife at bedside.

## 2015-07-29 NOTE — Progress Notes (Signed)
ANTICOAGULATION CONSULT NOTE - Follow Up Consult  Pharmacy Consult for Heparin  Indication: DVT  No Known Allergies  Patient Measurements: Height: 6\' 1"  (185.4 cm) Weight: 271 lb 2.7 oz (123 kg) IBW/kg (Calculated) : 79.9  Vital Signs: Temp: 97.6 F (36.4 C) (11/14 0006) Temp Source: Axillary (11/14 0006) BP: 104/79 mmHg (11/13 2200) Pulse Rate: 61 (11/13 2200)  Labs:  Recent Labs  07/27/15 1630 07/28/15 0254 07/28/15 0334 07/28/15 2345  HGB 8.9*  --  9.9*  --   HCT 29.1*  --  32.5*  --   PLT 280  --  200  --   HEPARINUNFRC  --   --   --  0.55  CREATININE 0.72 0.66  --   --     Estimated Creatinine Clearance: 114.6 mL/min (by C-G formula based on Cr of 0.66).  Assessment: Heparin for DVT, initial heparin level is therapeutic at 0.55  Goal of Therapy:  Heparin level 0.3-0.7 units/ml Monitor platelets by anticoagulation protocol: Yes   Plan:  -Continue heparin at 1800 units/hr -Confirmatory HL with AM labs  Narda Bonds 07/29/2015,12:38 AM

## 2015-07-29 NOTE — Progress Notes (Signed)
Speech Language Pathology Treatment: Dysphagia;Passy Muir Speaking valve  Patient Details Name: Thomas Zuniga MRN: [id number] DOB: 1940-12-29 Today's Date: 07/29/2015 Time: [id number] SLP Time Calculation (min) (ACUTE ONLY): 25 min  Assessment / Plan / Recommendation Clinical Impression  In-line PMSV was placed with RT present prior to po trials. Pt requested ice chips and is very eager to eat and drink. During trials of ice chips and teaspoon bites of puree pt did not show any s/s of aspiration and vital signs remained stable. SLP provided total assist during feeding. Hyolaryngeal movement appeared swift and adequate while SLP palpated throat. SLP recommends continued po trials, pt remains to have a high risk of aspiration and has a weak cough. After po trials and PMSV removal, RT provided oral suctioning with no evidence of residue. SLP followed up with SLP at [employer], pt had a FEES about a month ago, no penetration/aspiration, SLP proceeded cautiously with dys 1 (puree) and honey thick liquids as pulmonary status warranted cautious advancement. [facility] SLP recommended repeat swallow study, SLP agrees and will do an objective swallow assessment to determine readiness for diet.    HPI HPI: 74 year old male with quadriplegia status post fall & C-spine surgery. Patient treated for recent MRSA pneumonia. He was having repeated desaturations while on ventilator sometimes with simple repositioning. Chest x-ray was concerning for persistent pneumonia with RLL consolidation.      SLP Plan  Continue with current plan of care     Recommendations  Diet recommendations: NPO      Patient may use Passy-Muir Speech Valve: with SLP only PMSV Supervision: Full       Oral Care Recommendations: Oral care QID Follow up Recommendations: LTACH Plan: Continue with current plan of care  Lanier Ensign, Student-SLP  Lanier Ensign 07/29/2015, 1:40 PM

## 2015-07-30 ENCOUNTER — Inpatient Hospital Stay (HOSPITAL_COMMUNITY): Payer: Medicare Other

## 2015-07-30 DIAGNOSIS — R609 Edema, unspecified: Secondary | ICD-10-CM | POA: Diagnosis present

## 2015-07-30 DIAGNOSIS — R591 Generalized enlarged lymph nodes: Secondary | ICD-10-CM | POA: Diagnosis present

## 2015-07-30 DIAGNOSIS — A419 Sepsis, unspecified organism: Secondary | ICD-10-CM | POA: Diagnosis present

## 2015-07-30 DIAGNOSIS — G8250 Quadriplegia, unspecified: Secondary | ICD-10-CM | POA: Diagnosis present

## 2015-07-30 DIAGNOSIS — R600 Localized edema: Secondary | ICD-10-CM

## 2015-07-30 DIAGNOSIS — N179 Acute kidney failure, unspecified: Secondary | ICD-10-CM | POA: Diagnosis present

## 2015-07-30 DIAGNOSIS — Z9889 Other specified postprocedural states: Secondary | ICD-10-CM | POA: Diagnosis present

## 2015-07-30 DIAGNOSIS — N319 Neuromuscular dysfunction of bladder, unspecified: Secondary | ICD-10-CM | POA: Diagnosis present

## 2015-07-30 DIAGNOSIS — J189 Pneumonia, unspecified organism: Secondary | ICD-10-CM | POA: Diagnosis present

## 2015-07-30 DIAGNOSIS — I82402 Acute embolism and thrombosis of unspecified deep veins of left lower extremity: Secondary | ICD-10-CM

## 2015-07-30 DIAGNOSIS — J15212 Pneumonia due to Methicillin resistant Staphylococcus aureus: Secondary | ICD-10-CM

## 2015-07-30 DIAGNOSIS — Z930 Tracheostomy status: Secondary | ICD-10-CM | POA: Diagnosis present

## 2015-07-30 DIAGNOSIS — J9612 Chronic respiratory failure with hypercapnia: Secondary | ICD-10-CM

## 2015-07-30 DIAGNOSIS — Z978 Presence of other specified devices: Secondary | ICD-10-CM | POA: Diagnosis present

## 2015-07-30 DIAGNOSIS — I509 Heart failure, unspecified: Secondary | ICD-10-CM

## 2015-07-30 DIAGNOSIS — Z960 Presence of urogenital implants: Secondary | ICD-10-CM

## 2015-07-30 DIAGNOSIS — A4102 Sepsis due to Methicillin resistant Staphylococcus aureus: Secondary | ICD-10-CM | POA: Diagnosis present

## 2015-07-30 DIAGNOSIS — N390 Urinary tract infection, site not specified: Secondary | ICD-10-CM

## 2015-07-30 DIAGNOSIS — L89159 Pressure ulcer of sacral region, unspecified stage: Secondary | ICD-10-CM | POA: Diagnosis present

## 2015-07-30 DIAGNOSIS — N189 Chronic kidney disease, unspecified: Secondary | ICD-10-CM

## 2015-07-30 LAB — CULTURE, RESPIRATORY W GRAM STAIN

## 2015-07-30 LAB — POTASSIUM: Potassium: 3.2 mmol/L — ABNORMAL LOW (ref 3.5–5.1)

## 2015-07-30 LAB — HEPARIN LEVEL (UNFRACTIONATED): Heparin Unfractionated: 0.29 IU/mL — ABNORMAL LOW (ref 0.30–0.70)

## 2015-07-30 LAB — COMPREHENSIVE METABOLIC PANEL
ALT: 14 U/L — AB (ref 17–63)
AST: 19 U/L (ref 15–41)
Albumin: 1.8 g/dL — ABNORMAL LOW (ref 3.5–5.0)
Alkaline Phosphatase: 46 U/L (ref 38–126)
Anion gap: 11 (ref 5–15)
BUN: 16 mg/dL (ref 6–20)
CHLORIDE: 107 mmol/L (ref 101–111)
CO2: 25 mmol/L (ref 22–32)
CREATININE: 0.43 mg/dL — AB (ref 0.61–1.24)
Calcium: 8.2 mg/dL — ABNORMAL LOW (ref 8.9–10.3)
GFR calc non Af Amer: >60 mL/min (ref >60)
Glucose, Bld: 113 mg/dL — ABNORMAL HIGH (ref 65–99)
Potassium: 2.8 mmol/L — ABNORMAL LOW (ref 3.5–5.1)
SODIUM: 143 mmol/L (ref 135–145)
Total Bilirubin: 0.6 mg/dL (ref 0.3–1.2)
Total Protein: 5.9 g/dL — ABNORMAL LOW (ref 6.5–8.1)

## 2015-07-30 LAB — BODY FLUID CELL COUNT WITH DIFFERENTIAL: WBC FLUID: 7 uL (ref 0–1000)

## 2015-07-30 LAB — PROTEIN, BODY FLUID: Total protein, fluid: <3 g/dL

## 2015-07-30 LAB — LACTATE DEHYDROGENASE, PLEURAL OR PERITONEAL FLUID: LD FL: 2372 U/L — AB (ref 3–23)

## 2015-07-30 LAB — GLUCOSE, CAPILLARY
GLUCOSE-CAPILLARY: 105 mg/dL — AB (ref 65–99)
GLUCOSE-CAPILLARY: 131 mg/dL — AB (ref 65–99)
GLUCOSE-CAPILLARY: 132 mg/dL — AB (ref 65–99)
GLUCOSE-CAPILLARY: 112 mg/dL — AB (ref 65–99)
GLUCOSE-CAPILLARY: 119 mg/dL — AB (ref 65–99)
Glucose-Capillary: 115 mg/dL — ABNORMAL HIGH (ref 65–99)

## 2015-07-30 LAB — CULTURE, BLOOD (ROUTINE X 2)

## 2015-07-30 LAB — CULTURE, RESPIRATORY

## 2015-07-30 LAB — GLUCOSE, SEROUS FLUID: GLUCOSE FL: 36 mg/dL

## 2015-07-30 LAB — CBC
HCT: 26.4 % — ABNORMAL LOW (ref 39.0–52.0)
Hemoglobin: 7.6 g/dL — ABNORMAL LOW (ref 13.0–17.0)
MCH: 26.1 pg (ref 26.0–34.0)
MCHC: 28.8 g/dL — ABNORMAL LOW (ref 30.0–36.0)
MCV: 90.7 fL (ref 78.0–100.0)
PLATELETS: 349 10*3/uL (ref 150–400)
RBC: 2.91 MIL/uL — AB (ref 4.22–5.81)
RDW: 17.3 % — ABNORMAL HIGH (ref 11.5–15.5)
WBC: 10.6 10*3/uL — AB (ref 4.0–10.5)

## 2015-07-30 LAB — PROCALCITONIN: PROCALCITONIN: 2.4 ng/mL

## 2015-07-30 LAB — MAGNESIUM: Magnesium: 1.6 mg/dL — ABNORMAL LOW (ref 1.7–2.4)

## 2015-07-30 MED ORDER — POTASSIUM CHLORIDE 10 MEQ/100ML IV SOLN
10.0000 meq | INTRAVENOUS | Status: AC
  Administered 2015-07-30 (×5): 10 meq via INTRAVENOUS
  Filled 2015-07-30 (×5): qty 100

## 2015-07-30 MED ORDER — PANTOPRAZOLE SODIUM 40 MG PO PACK
40.0000 mg | PACK | Freq: Two times a day (BID) | ORAL | Status: DC
  Administered 2015-07-30 – 2015-08-06 (×14): 40 mg
  Filled 2015-07-30 (×15): qty 20

## 2015-07-30 MED ORDER — LIDOCAINE HCL (PF) 1 % IJ SOLN
INTRAMUSCULAR | Status: AC
  Filled 2015-07-30: qty 10

## 2015-07-30 NOTE — Progress Notes (Signed)
  Echocardiogram 2D Echocardiogram has been performed.  Donata Clay 07/30/2015, 1:41 PM

## 2015-07-30 NOTE — Procedures (Signed)
US guided diagnostic right thoracentesis performed yielding 20 cc clear, yellow fluid. The fluid was sent to the lab for preordered studies. The fluid collection was multiseptated. F/u CXR pending. No immediate complications.

## 2015-07-30 NOTE — Progress Notes (Signed)
[facility] TEAM 1 - Stepdown/ICU TEAM Progress Note  Thomas Zuniga [id number] DOB: 1940-12-13 DOA: 07/27/2015 PCP: Verneita Griffes, MD  Admit HPI / Brief Narrative: 74 year old WM male PMHx Depression, DM Type 2 without complication, Atrial fib, HTN, Ventricular Tachycardia, S/P Pacemaker COPD, Chronic Respiratory Failure, Vent Dependent, S/P tracheostomy HLD, with Quadriplegia S/P S/P  fall & C-spine surgery.   Patient treated for recent MRSA pneumonia. He was having repeated desaturations while on ventilator at [facility], sometimes with simple repositioning. Chest x-ray was concerning for persistent pneumonia & patient was transferred to the ED at [facility] for further evaluation.   HPI/Subjective: 11/15 alert follows commands currently unable to answer questions while on vent.   Assessment/Plan: Acute on chronic hypoxic respiratory failure w/ chronic hypercarbic respiratory failure -Ventilator management per PCCM  Sepsis secondary to HCAP v/s UTI v/s other  -11/12 trach aspirate positive MRSA -Recently treated for MRSA pneumonia -11/15 requested IR PICC line placement secondary to poor access  Lymphadenopathy -Considering patient's pathologically  Enlarged right hilar & mediastinal lymph nodes, will require thoracentesis for further evaluation  -IR thoracentesis pending  Right Pleural Effusion -trapped lung versus parapneumonic effusion -IR thoracentesis for culture/pathology; hold heparin drip 3-4 hours prior to thoracentesis  - PCCM to consider TCTS evaluation;    Trach dependent  -Previously doing 12hr T bar trials - vent and trach care per PCCM  COPD -No evidence of acute exacerbation this time  Chronic hypotension -treated with Midodrine 5 mg BID    UTI (positive GNR) -Continue antibiotics awaiting speciation and susceptibility -Chronic foley  1/2 blood cx positive coag negative staph  -Most likely contamination   L LE DVT (groin to knee) -Noted on duplex -  unclear chronicity - IV heparin continues  H/O Atrial fibrillation Normal sinus rhythm presently  H/O Ventricular tachycardia  Dyslipidemia  Hypokalemia -Potassium IV 50 mEq -Recheck potassium at 1300  Hypomagnesemia  -Recheck magnesium 1300   Chronic foley due to Neurogenic bladder -Foley changed 11/9 per outside records - changed again 11/13 due to clogging   Chronic Dysphagia - PEG dependent  -SLP evaluation pending - continue to utilize PEG for meds and feeds at this time  Anemia - normocytic -No evidence of significant acute blood loss - follow trend  DM2 -CBG well controlled at this time -Hemoglobin A1c pending  Quadriplegia   Sacral decub WOC to see   Code Status: FULL Family Communication: Wife present at time of exam Disposition Plan: SNF   Consultants: Dr.Wesam Kathryne Sharper PCCM    Procedure/Significant Events: 11/09 - Foley change 11/12 - CTA Chest - No PE.  -Moderate right pleural effusion with peripheral rind of enhancement./Right lower lobe consolidation.  -Enlarged right hilar & mediastinal lymph nodes.  11/13 - Admit to Hospital from [facility] 11/13 - venous duplex - extensive L LE DVT   Culture 11/12 blood left hand NGTD 11/12 blood left hand positive coag negative staph 11/12 urine positive GNR 11/12 trach aspirate positive MRSA   Antibiotics: Vanc 11/12 > Zosyn 11/12 >  DVT prophylaxis: IV heparin   Devices Tracheostomy   LINES / TUBES:      Continuous Infusions: . sodium chloride 75 mL/hr at 07/30/15 2124  . heparin 1,850 Units/hr (07/30/15 2118)    Objective: VITAL SIGNS: Temp: 97.1 F (36.2 C) (11/15 2000) Temp Source: Oral (11/15 2000) BP: 110/72 mmHg (11/15 2118) Pulse Rate: 87 (11/15 2118) SPO2; FIO2:   Intake/Output Summary (Last 24 hours) at 07/30/15 2340 Last data filed at 07/30/15 2218  Gross per 24 hour  Intake   1514 ml  Output   2925 ml  Net  -1411 ml     Exam: General:  alert follows  commands currently unable to answer questions while on vent, NAD, chronic respiratory distress Eyes: Negative headache,negative scleral hemorrhage ENT: Negative Runny nose, negative gingival bleeding, Neck:  Negative scars, masses, torticollis, lymphadenopathy, JVD, trach in place, no signs of infection Lungs: Clear to auscultation left lung, RML/RLL decreased absent breath sounds, negative wheezes or crackles Cardiovascular: Regular rate and rhythm without murmur gallop or rub normal S1 and S2 Abdomen: Morbidly obese, nondistended, positive soft, bowel sounds, no rebound, no ascites, no appreciable mass, PEG tube present negative sign of infection Extremities: No significant cyanosis, clubbing, or edema bilateral lower extremities Psychiatric:  Negative depression, negative anxiety, negative fatigue, negative mania Neurologic:  Cranial nerves II through XII intact, negative receptive aphasia.   Data Reviewed: Basic Metabolic Panel:  Recent Labs Lab 07/27/15 1630 07/28/15 0253 07/28/15 0254 07/28/15 0334 07/28/15 2345 07/29/15 0157 07/30/15 0535 07/30/15 1245  NA 140  --  139  --  143 143 143  --   K 3.4*  --  4.2  --  2.6* 2.8* 2.8* 3.2*  CL 95*  --  97*  --  102 104 107  --   CO2 34*  --  31  --  28 29 25   --   GLUCOSE 218*  --  90  --  102* 131* 113*  --   BUN 28*  --  25*  --  18 17 16   --   CREATININE 0.72  --  0.66  --  0.54* 0.55* 0.43*  --   CALCIUM 8.7*  --  8.1*  --  8.4* 8.2* 8.2*  --   MG  --  1.7  --  2.6* 1.6*  --   --  1.6*  PHOS  --   --  1.8*  --  4.9*  --   --   --    Liver Function Tests:  Recent Labs Lab 07/27/15 1630 07/28/15 0254 07/30/15 0535  AST 38  --  19  ALT 22  --  14*  ALKPHOS 64  --  46  BILITOT 0.6  --  0.6  PROT 7.2  --  5.9*  ALBUMIN 2.1* 1.9* 1.8*   No results for input(s): LIPASE, AMYLASE in the last 168 hours. No results for input(s): AMMONIA in the last 168 hours. CBC:  Recent Labs Lab 07/27/15 1630 07/28/15 0334  07/28/15 0451 07/28/15 2345 07/29/15 1200 07/30/15 0535  WBC 12.7* 7.8  --  13.1* 10.7* 10.6*  NEUTROABS 11.4* 5.7  --   --   --   --   HGB 8.9* 9.9*  --  8.3* 7.4* 7.6*  HCT 29.1* 32.5* 24.4* 27.1* 25.4* 26.4*  MCV 89.0 100.0  --  90.9 91.7 90.7  PLT 280 200  --  330 281 349   Cardiac Enzymes: No results for input(s): CKTOTAL, CKMB, CKMBINDEX, TROPONINI in the last 168 hours. BNP (last 3 results)  Recent Labs  07/27/15 1630  BNP 481.5*    ProBNP (last 3 results) No results for input(s): PROBNP in the last 8760 hours.  CBG:  Recent Labs Lab 07/30/15 0414 07/30/15 0753 07/30/15 1225 07/30/15 1659 07/30/15 2036  GLUCAP 105* 131* 132* 112* 119*    Recent Results (from the past 240 hour(s))  Blood Culture (routine x 2)     Status: None (Preliminary result)  Collection Time: 07/27/15  4:30 PM  Result Value Ref Range Status   Specimen Description BLOOD LEFT HAND  Final   Special Requests BOTTLES DRAWN AEROBIC AND ANAEROBIC 5CC  Final   Culture NO GROWTH 3 DAYS  Final   Report Status PENDING  Incomplete  Blood Culture (routine x 2)     Status: None   Collection Time: 07/27/15  4:50 PM  Result Value Ref Range Status   Specimen Description BLOOD LEFT HAND  Final   Special Requests IN PEDIATRIC BOTTLE 2CC  Final   Culture  Setup Time   Final    GRAM POSITIVE COCCI IN CLUSTERS AEROBIC BOTTLE ONLY CRITICAL RESULT CALLED TO, READ BACK BY AND VERIFIED WITH: P KEATON,RN AT [id number] 07/28/15 BY L BENFIELD    Culture   Final    STAPHYLOCOCCUS SPECIES (COAGULASE NEGATIVE) THE SIGNIFICANCE OF ISOLATING THIS ORGANISM FROM A SINGLE VENIPUNCTURE CANNOT BE PREDICTED WITHOUT FURTHER CLINICAL AND CULTURE CORRELATION. SUSCEPTIBILITIES AVAILABLE ONLY ON REQUEST.    Report Status 07/30/2015 FINAL  Final  Urine culture     Status: None (Preliminary result)   Collection Time: 07/27/15  4:50 PM  Result Value Ref Range Status   Specimen Description URINE, RANDOM  Final   Special Requests  NONE  Final   Culture >=100,000 COLONIES/mL GRAM NEGATIVE RODS  Final   Report Status PENDING  Incomplete  Culture, respiratory (NON-Expectorated)     Status: None   Collection Time: 07/27/15  5:28 PM  Result Value Ref Range Status   Specimen Description TRACHEAL ASPIRATE  Final   Special Requests NONE  Final   Gram Stain   Final    ABUNDANT WBC PRESENT,BOTH PMN AND MONONUCLEAR NO SQUAMOUS EPITHELIAL CELLS SEEN NO ORGANISMS SEEN Performed at [facility]    Culture   Final    MODERATE METHICILLIN RESISTANT STAPHYLOCOCCUS AUREUS Note: RIFAMPIN AND GENTAMICIN SHOULD NOT BE USED AS SINGLE DRUGS FOR TREATMENT OF STAPH INFECTIONS. This organism DOES NOT demonstrate inducible Clindamycin resistance in vitro. CRITICAL RESULT CALLED TO, READ BACK BY AND VERIFIED WITH: NICOLE WALLER AT  [id number] ON 07/30/15 BY WEBSP Performed at [facility]    Report Status 07/30/2015 FINAL  Final   Organism ID, Bacteria METHICILLIN RESISTANT STAPHYLOCOCCUS AUREUS  Final      Susceptibility   Methicillin resistant staphylococcus aureus - MIC*    CLINDAMYCIN <=0.25 SENSITIVE Sensitive     ERYTHROMYCIN >=8 RESISTANT Resistant     GENTAMICIN <=0.5 SENSITIVE Sensitive     LEVOFLOXACIN >=8 RESISTANT Resistant     OXACILLIN >=4 RESISTANT Resistant     RIFAMPIN <=0.5 SENSITIVE Sensitive     TRIMETH/SULFA >=320 RESISTANT Resistant     VANCOMYCIN 1 SENSITIVE Sensitive     TETRACYCLINE <=1 SENSITIVE Sensitive     * MODERATE METHICILLIN RESISTANT STAPHYLOCOCCUS AUREUS  MRSA PCR Screening     Status: Abnormal   Collection Time: 07/28/15  3:23 AM  Result Value Ref Range Status   MRSA by PCR POSITIVE (A) NEGATIVE Final    Comment:        The GeneXpert MRSA Assay (FDA approved for NASAL specimens only), is one component of a comprehensive MRSA colonization surveillance program. It is not intended to diagnose MRSA infection nor to guide or monitor treatment for MRSA infections. RESULT CALLED TO,  READ BACK BY AND VERIFIED WITH: SPOKE TO RB PERRIN,J AT 6:32 ON 07/28/2015 [email or web address]   Body fluid culture     Status: None (Preliminary result)  Collection Time: 07/30/15  3:40 PM  Result Value Ref Range Status   Specimen Description FLUID RIGHT PLEURAL  Final   Special Requests NONE  Final   Gram Stain   Final    WBC PRESENT, PREDOMINANTLY MONONUCLEAR NO ORGANISMS SEEN CYTOSPIN    Culture PENDING  Incomplete   Report Status PENDING  Incomplete     Studies:  Recent x-ray studies have been reviewed in detail by the Attending Physician  Scheduled Meds:  Scheduled Meds: . antiseptic oral rinse  7 mL Mouth Rinse QID  . chlorhexidine gluconate  15 mL Mouth/Throat BID  . Chlorhexidine Gluconate Cloth  6 each Topical Q0600  . digoxin  0.125 mg Per Tube Daily  . escitalopram  20 mg Per Tube Daily  . feeding supplement (PRO-STAT SUGAR FREE 64)  60 mL Per Tube 5 X Daily  . feeding supplement (VITAL HIGH PROTEIN)  1,000 mL Per Tube Q24H  . fluconazole  200 mg Per Tube Daily  . folic acid  1 mg Per Tube Daily  . free water  200 mL Per Tube Q4H  . gabapentin  800 mg Per Tube BID  . insulin aspart  0-9 Units Subcutaneous 6 times per day  . ipratropium-albuterol  3 mL Nebulization QID  . lidocaine (PF)      . metoprolol tartrate  12.5 mg Per Tube BID  . midodrine  5 mg Per Tube BID WC  . mirtazapine  15 mg Per Tube QHS  . mupirocin ointment  1 application Nasal BID  . nystatin   Topical TID  . pantoprazole sodium  40 mg Per Tube BID  . piperacillin-tazobactam (ZOSYN)  IV  3.375 g Intravenous Q8H  . vancomycin  1,000 mg Intravenous Q12H  . vitamin B-12  250 mcg Per Tube Daily    Time spent on care of this patient: 40 mins   Mani Celestin, Geraldo Docker , MD  [facility] Office  [phone] Pager - [phone]  On-Call/Text Page:      [email or web address]      password [email or web address]  If 7PM-7AM, please contact night-coverage [email or web address] Password [email or web address] 07/30/2015, 11:40 PM   LOS: 2 days    Care during the described time interval was provided by me .  I have reviewed this patient's available data, including medical history, events of note, physical examination, and all test results as part of my evaluation. I have personally reviewed and interpreted all radiology studies.   Dia Crawford, MD [phone] Pager

## 2015-07-30 NOTE — Progress Notes (Signed)
ANTICOAGULATION CONSULT NOTE - Follow Up Consult  Pharmacy Consult for Heparin Indication: DVT  No Known Allergies  Patient Measurements: Height: 6\' 1"  (185.4 cm) Weight: 284 lb 9.8 oz (129.1 kg) IBW/kg (Calculated) : 79.9 Heparin Dosing Weight: 106  Vital Signs: Temp: 98.9 F (37.2 C) (11/15 0754) Temp Source: Oral (11/15 0754) BP: 117/79 mmHg (11/15 1034) Pulse Rate: 69 (11/15 1034)  Labs:  Recent Labs  07/28/15 2345 07/29/15 0157 07/29/15 1200 07/30/15 0535  HGB 8.3*  --  7.4* 7.6*  HCT 27.1*  --  25.4* 26.4*  PLT 330  --  281 349  HEPARINUNFRC 0.55  --  0.51 0.29*  CREATININE 0.54* 0.55*  --  0.43*    Estimated Creatinine Clearance: 117.6 mL/min (by C-G formula based on Cr of 0.43).  Assessment: 74 yo M started on heparin for LLE DVT.  HL slightly low this AM at 0.29 - has previously been 0.54 and 0.55 on same rate of 1800 units/hr.  Hgb is trending down but no active bleeding noted.  Will monitor closely since pt has hx of recent GIB and anemia.  Goal of Therapy:  Heparin level 0.3-0.7 units/ml Monitor platelets by anticoagulation protocol: Yes   Plan:  increase heparin to 1850 units/hr Daily heparin level and CBC Follow up plans for long term anticoagulation  Eudelia Bunch, Pharm.D. [id number] 07/30/2015 11:46 AM

## 2015-07-30 NOTE — Progress Notes (Signed)
Speech Language Pathology Treatment: [name] Speaking valve;Dysphagia  Patient Details Name: Thomas Zuniga MRN: [id number] DOB: 1940-11-29 Today's Date: 07/30/2015 Time: 1150-1230 SLP Time Calculation (min) (ACUTE ONLY): 40 min  Assessment / Plan / Recommendation Clinical Impression  SLP treated pt with RT present to manage vent settings during PMSV placement. Pt tolerated PMSV for approximately 20 minutes with adequate redirections of air and 1005 intelligibility at conversation level. Attempted FEES for objective assessment of swallowing, but technical difficulties prevented assessment. Given report of adequate swallow function on prior FEES, attempted trials of thin liquids, tolerated without overt signs of aspiration. Pts function promising for return to PO intake. Plan to continue efforts of PMSV placement and swallow assessment as able.    HPI HPI: 74 year old male with quadriplegia status post fall & C-spine surgery. Patient treated for recent MRSA pneumonia. He was having repeated desaturations while on ventilator sometimes with simple repositioning. Chest x-ray was concerning for persistent pneumonia with RLL consolidation.      SLP Plan  Continue with current plan of care     Recommendations  Diet recommendations: NPO Liquids provided via: Straw Medication Administration: Via alternative means      Patient may use Passy-Muir Speech Valve: with SLP only PMSV Supervision: Full       Plan: Continue with current plan of care  [facility], [place] CCC-SLP [phone]  Lynann Beaver 07/30/2015, 1:57 PM

## 2015-07-30 NOTE — Progress Notes (Signed)
RT at bedside to assist SLP with PMV inline trials. Pt tolerating sips of coke and talking for approximately 10 minutes and pt was returned to original vent settings. Cuff reinflated and PEEP returned to 5. RT will continue to monitor.

## 2015-07-30 NOTE — Progress Notes (Signed)
PULMONARY / CRITICAL CARE MEDICINE   Name: Thomas Zuniga MRN: [id number] DOB: 12-Sep-1941    ADMISSION DATE:  07/27/2015 CONSULTATION DATE:  07/28/2015  REFERRING MD :  EDP  CHIEF COMPLAINT:  HCAP  INITIAL PRESENTATION:  74 year old male with quadriplegia status post fall & C-spine surgery. Patient treated for recent MRSA pneumonia. He was having repeated desaturations while on ventilator sometimes with simple repositioning. Chest x-ray was concerning for persistent pneumonia & patient was transferred to the emergency department for further evaluation.  STUDIES:  CTA Chest 07/27/15 (personally reviewed by me) - No PE. Moderate right pleural effusion with peripheral rind of enhancement. Right lower lobe consolidation. Enlarged right hilar & mediastinal lymph nodes. No pericardial effusion. VENOUS DUPLEX 07/28/15 - Extensive LLE DVT TTE 07/30/15 - PENDING  SIGNIFICANT EVENTS: 11/09 - Foley change 11/13 - Admit to Hospital from [place]: 07/24/15 ABG (on PCV 28/5 0.4 22):  7.42/72/97  06/27/15 CBC: 6.7/8.1/24.8/223 BMP: 138/4.0/96/33/13/0.5/100/8.3 Magnesium: 1.8 LFT: 2.6/6.1/0.7/75/18/29  CULTURES: Right Pleural Fluid 11/15>>> BCx2 11/12>>>Coag Neg Staph 1/2 UC 11/12>>>GNR/Pending Tracheal Asp 11/12 - MRSA  ANTIBIOTICS: Vancomycin 11/12>>> Zosyn 11/12>>> Diflucan via tube 11/2>>> Nystatin Powder 11/2>>>  HISTORY OF PRESENT ILLNESS:  History was obtained from the patient's wife and my review of his medical records from [place]. He is an unfortunate 74 year old male who suffered a fall and underwent subsequent C-spine surgeries ultimately leading to quadriplegia. The patient previously underwent tracheostomy & PEG tube placement. Per the patient's records fairly recently he was treated for an MRSA pneumonia with vancomycin. Exactly when this was is unclear from the records. I can discern that his ventilator settings were increased recently/today and according to  his wife this was secondary to intermittent hypoxia. She is unsure whether or not he actually had any fever while at the facility. Most notably the patient seem to desaturate with repositioning at times. On arrival in the emergency department the patient was noted to have an elevated lactic acid as well as right lower lobe consolidation with associated pleural effusion that appears to have a rind. Previously the patient was tolerating T bar trials for 12 hours at a time. He has continued to have difficulty with swallowing and was undergoing speech therapy. The patient's records also note of a recent GI bleed.  SUBJECTIVE: No acute events overnight. Appears comfortable laying in bed on ventilator. Patient is having significant amount of respiratory secretions.  REVIEW OF SYSTEMS: Unable to obtain accurate review of systems the patient remains on ventilator.  VITAL SIGNS: Temp:  [97.5 F (36.4 C)-98.9 F (37.2 C)] 98.3 F (36.8 C) (11/15 1701) Pulse Rate:  [61-90] 90 (11/15 1701) Resp:  [17-28] 21 (11/15 1701) BP: (93-123)/(59-84) 93/59 mmHg (11/15 1701) SpO2:  [96 %-100 %] 97 % (11/15 1701) FiO2 (%):  [30 %] 30 % (11/15 1602) Weight:  [284 lb 9.8 oz (129.1 kg)] 284 lb 9.8 oz (129.1 kg) (11/15 0500) HEMODYNAMICS:   VENTILATOR SETTINGS: Vent Mode:  [-] PRVC FiO2 (%):  [30 %] 30 % Set Rate:  [17 bmp] 17 bmp Vt Set:  [480 mL] 480 mL PEEP:  [5 cmH20] 5 cmH20 Plateau Pressure:  [23 [id number] cmH20] 23 cmH20 INTAKE / OUTPUT:  Intake/Output Summary (Last 24 hours) at 07/30/15 1706 Last data filed at 07/30/15 1612  Gross per 24 hour  Intake   2654 ml  Output   3100 ml  Net   -446 ml   PHYSICAL EXAMINATION: General:  Awake. Alert. No acute distress. Wife at bedside.  Integument:  Warm & dry. Right upper extremity PICC line in place. HEENT:  Moist mucus membranes. Tracheostomy in place. No scleral injection. Cardiovascular:  Regular rate. Bilateral lower extremity edema persists given body  habitus. No appreciable JVD.  Pulmonary:  Coarse breath sounds bilaterally on ventilator. Symmetric chest wall rise on ventilator.  Abdomen: Soft. Normal bowel sounds. Nondistended. PEG tube in place.  LABS:  CBC  Recent Labs Lab 07/28/15 2345 07/29/15 1200 07/30/15 0535  WBC 13.1* 10.7* 10.6*  HGB 8.3* 7.4* 7.6*  HCT 27.1* 25.4* 26.4*  PLT 330 281 349   Coag's No results for input(s): APTT, INR in the last 168 hours. BMET  Recent Labs Lab 07/28/15 2345 07/29/15 0157 07/30/15 0535 07/30/15 1245  NA 143 143 143  --   K 2.6* 2.8* 2.8* 3.2*  CL 102 104 107  --   CO2 28 29 25   --   BUN 18 17 16   --   CREATININE 0.54* 0.55* 0.43*  --   GLUCOSE 102* 131* 113*  --    Electrolytes  Recent Labs Lab 07/28/15 0254 07/28/15 0334 07/28/15 2345 07/29/15 0157 07/30/15 0535 07/30/15 1245  CALCIUM 8.1*  --  8.4* 8.2* 8.2*  --   MG  --  2.6* 1.6*  --   --  1.6*  PHOS 1.8*  --  4.9*  --   --   --    Sepsis Markers  Recent Labs Lab 07/27/15 1701 07/27/15 2058 07/27/15 2246 07/28/15 0334 07/28/15 2345 07/30/15 0535  LATICACIDVEN 3.12* 3.32*  --  0.6  --   --   PROCALCITON  --   --  2.40  --  2.47 2.40   ABG  Recent Labs Lab 07/27/15 1706 07/28/15 0440 07/28/15 0630  PHART 7.556* 7.626* 7.490*  PCO2ART 42.3 29.3* 41.3  PO2ART 135.0* 115* 81.9   Liver Enzymes  Recent Labs Lab 07/27/15 1630 07/28/15 0254 07/30/15 0535  AST 38  --  19  ALT 22  --  14*  ALKPHOS 64  --  46  BILITOT 0.6  --  0.6  ALBUMIN 2.1* 1.9* 1.8*   Cardiac Enzymes No results for input(s): TROPONINI, PROBNP in the last 168 hours. Glucose  Recent Labs Lab 07/29/15 1250 07/29/15 1654 07/29/15 2021 07/30/15 0008 07/30/15 0414 07/30/15 0753  GLUCAP 130* 116* 112* 115* 105* 131*    Imaging Dg Chest Port 1 View  07/30/2015  CLINICAL DATA:  Post right-sided thoracentesis EXAM: PORTABLE CHEST 1 VIEW COMPARISON:  07/27/2015; chest CT - 07/27/2015 FINDINGS: Grossly unchanged  enlarged cardiac silhouette and mediastinal contours. Interval removal of right upper extremity approach PICC line. Otherwise, stable position of support apparatus. Grossly unchanged small partially loculated right-sided effusion post thoracentesis. No pneumothorax. Right basilar heterogeneous/consolidative opacities are unchanged. Mild pulmonary congestion without frank evidence of edema. Unchanged bones including lower cervical ACDF and paraspinal fusion, incompletely evaluated. IMPRESSION: 1. No evidence of pneumothorax following right-sided thoracentesis. 2. Grossly unchanged small partially loculated right-sided effusion with associated right medial basilar heterogeneous/consolidative opacities again worrisome for infection. 3. Pulmonary venous congestion without frank evidence of edema. 4. Interval removal of right upper extremity approach PICC line. Electronically Signed   By: Sandi Mariscal M.D.   On: 07/30/2015 15:58   US Thoracentesis Asp Pleural Space W/img Guide  07/30/2015  INDICATION: Patient with history of respiratory failure on ventilator, pneumonia, right pleural effusion. Request is made for diagnostic right thoracentesis. EXAM: ULTRASOUND GUIDED DIAGNOSTIC RIGHT THORACENTESIS COMPARISON:  None. MEDICATIONS: None COMPLICATIONS:  None immediate TECHNIQUE: Informed written consent was obtained from the patient's wife after a discussion of the risks, benefits and alternatives to treatment. A timeout was performed prior to the initiation of the procedure. Initial ultrasound scanning demonstrates a small complex right pleural effusion. The lower chest was prepped and draped in the usual sterile fashion. 1% lidocaine was used for local anesthesia. Under direct ultrasound guidance, a 19 gauge, 10-cm, Yueh catheter was introduced. An ultrasound image was saved for documentation purposes. The thoracentesis was performed. The catheter was removed and a dressing was applied. The patient tolerated the  procedure well without immediate post procedural complication. The patient was escorted to have an upright chest radiograph. FINDINGS: A total of approximately 20 cc of clear, yellow fluid was removed. Requested samples were sent to the laboratory. The pleural fluid collection was complex/multi-septated. IMPRESSION: Successful ultrasound-guided diagnostic right sided thoracentesis yielding 20 cc of pleural fluid. Read by: Rowe Robert, PA-C Electronically Signed   By: Sandi Mariscal M.D.   On: 07/30/2015 15:55   ASSESSMENT / PLAN:  74 year old male with acute on chronic hypoxic respiratory failure status post prior tracheostomy & PEG tube placement. Patient has a right pleural effusion that underwent thoracentesis by interventional radiology today. Patient's ventilatory requirement continues to remain stable but secretions are a major barrier to attempt at tracheostomy collar or T bar trials. Patient again has grown out MRSA from his tracheal aspirate. Patient's long-term prognosis remains guarded. The patient's wife was updated regarding his respiratory status again by me this morning.  1. Acute on chronic hypoxic respiratory failure: Status post Shiley #6 Cuffed Prox XLT Trach (from outside) placement. Continuing to wean FiO2 for saturation greater than 94%. Continuing treatment of underlying MRSA pneumonia. Continuing to nebs every 6 hours for airway clearance. Holding on tracheostomy collar & T bar trials for now. 2. MRSA pneumonia: Continuing on treatment with Vancomycin Day #4/14. 3. Chronic hypercarbic respiratory failure: Continuing Duonebs. No evidence for acute decompensation. 4. Right pleural effusion: Likely some element of trapped lung based on CT imaging. Status post thoracentesis today with culture pending. 5. Left lower extremity DVT: Continuing on heparin drip for systemic anticoagulation per pharmacy protocol.  Sonia Baller Ashok Cordia, M.D. [facility] Pulmonary & Critical Care Pager:   [phone] After 3pm or if no response, call [phone] 07/30/2015, 5:06 PM

## 2015-07-31 DIAGNOSIS — J9622 Acute and chronic respiratory failure with hypercapnia: Secondary | ICD-10-CM

## 2015-07-31 LAB — LIPID PANEL
CHOL/HDL RATIO: 6 ratio
Cholesterol: 119 mg/dL (ref 0–200)
HDL: 20 mg/dL — ABNORMAL LOW (ref >40)
LDL Cholesterol: 73 mg/dL (ref 0–99)
Triglycerides: 128 mg/dL (ref <150)
VLDL: 26 mg/dL (ref 0–40)

## 2015-07-31 LAB — CBC
HCT: 28.5 % — ABNORMAL LOW (ref 39.0–52.0)
Hemoglobin: 8.1 g/dL — ABNORMAL LOW (ref 13.0–17.0)
MCH: 25.9 pg — AB (ref 26.0–34.0)
MCHC: 28.4 g/dL — AB (ref 30.0–36.0)
MCV: 91.1 fL (ref 78.0–100.0)
PLATELETS: 338 10*3/uL (ref 150–400)
RBC: 3.13 MIL/uL — AB (ref 4.22–5.81)
RDW: 17.4 % — ABNORMAL HIGH (ref 11.5–15.5)
WBC: 9.5 10*3/uL (ref 4.0–10.5)

## 2015-07-31 LAB — PH, BODY FLUID: PH, BODY FLUID: 8

## 2015-07-31 LAB — GLUCOSE, CAPILLARY
GLUCOSE-CAPILLARY: 106 mg/dL — AB (ref 65–99)
GLUCOSE-CAPILLARY: 111 mg/dL — AB (ref 65–99)
GLUCOSE-CAPILLARY: 116 mg/dL — AB (ref 65–99)
Glucose-Capillary: 112 mg/dL — ABNORMAL HIGH (ref 65–99)
Glucose-Capillary: 122 mg/dL — ABNORMAL HIGH (ref 65–99)
Glucose-Capillary: 112 mg/dL — ABNORMAL HIGH (ref 65–99)
Glucose-Capillary: 127 mg/dL — ABNORMAL HIGH (ref 65–99)

## 2015-07-31 LAB — URINE CULTURE

## 2015-07-31 LAB — HEPARIN LEVEL (UNFRACTIONATED)
HEPARIN UNFRACTIONATED: 0.41 [IU]/mL (ref 0.30–0.70)
Heparin Unfractionated: 1.1 IU/mL — ABNORMAL HIGH (ref 0.30–0.70)

## 2015-07-31 LAB — AMYLASE, PLEURAL FLUID: AMYLASE, PLEURAL FLUID: 13 U/L

## 2015-07-31 MED ORDER — DEXTROSE 5 % IV SOLN
1.0000 g | INTRAVENOUS | Status: DC
  Filled 2015-07-31 (×2): qty 10

## 2015-07-31 MED ORDER — FREE WATER
200.0000 mL | Freq: Four times a day (QID) | Status: DC
  Administered 2015-08-01 – 2015-08-06 (×22): 200 mL

## 2015-07-31 NOTE — Care Management Important Message (Signed)
Important Message  Patient Details  Name: Dack Rivett MRN: [id number] Date of Birth: [birth year]   Medicare Important Message Given:  Yes    Shakia Sebastiano, Kym Groom, RN 07/31/2015, 12:52 PM

## 2015-07-31 NOTE — Progress Notes (Signed)
ANTICOAGULATION CONSULT NOTE - Follow Up Consult  Pharmacy Consult for Heparin Indication: DVT  No Known Allergies  Patient Measurements: Height: 6\' 1"  (185.4 cm) Weight: 284 lb 2.8 oz (128.9 kg) IBW/kg (Calculated) : 79.9 Heparin Dosing Weight: 106  Vital Signs: Temp: 98.6 F (37 C) (11/16 0800) Temp Source: Oral (11/16 0800) BP: 118/68 mmHg (11/16 1000) Pulse Rate: 60 (11/16 1000)  Labs:  Recent Labs  07/28/15 2345 07/29/15 0157 07/29/15 1200 07/30/15 0535 07/31/15 0355 07/31/15 0702  HGB 8.3*  --  7.4* 7.6* 8.1*  --   HCT 27.1*  --  25.4* 26.4* 28.5*  --   PLT 330  --  281 349 338  --   HEPARINUNFRC 0.55  --  0.51 0.29* 1.10* 0.41  CREATININE 0.54* 0.55*  --  0.43*  --   --     Estimated Creatinine Clearance: 117.5 mL/min (by C-G formula based on Cr of 0.43).  Assessment: 74 yo Zuniga started on heparin for LLE DVT. AM HL 1.1 drawn from same arm as heparin drip. Repeat HL draw therapeutic at 0.41 on 1850 units/hr.  Hgb up to 8.1, pltc WNL. .  No active bleeding noted.  Will monitor closely since pt has hx of recent GIB and anemia.  Goal of Therapy:  Heparin level 0.3-0.7 units/ml Monitor platelets by anticoagulation protocol: Yes   Plan:  Continue heparin at 1850 units/hr Daily heparin level and CBC Follow up plans for long term anticoagulation  Eudelia Bunch, Pharm.D. [id number] 07/31/2015 11:25 AM

## 2015-07-31 NOTE — Progress Notes (Signed)
Spoke to RN ana.  She stated that dr. Dorothy Puffer feels that the IV is sufficient for now and PICC isn't necessary.  The order will be canceled.  Please re-order if needed. Wasil Wolke RT-R

## 2015-07-31 NOTE — Progress Notes (Addendum)
PULMONARY / CRITICAL CARE MEDICINE   Name: Thomas Zuniga MRN: [id number] DOB: 1940-10-23    ADMISSION DATE:  07/27/2015 CONSULTATION DATE:  07/28/2015  REFERRING MD :  EDP  CHIEF COMPLAINT:  HCAP  INITIAL PRESENTATION:  74 year old male with quadriplegia status post fall & C-spine surgery. Patient treated for recent MRSA pneumonia. He was having repeated desaturations while on ventilator sometimes with simple repositioning. Chest x-ray was concerning for persistent pneumonia & patient was transferred to the emergency department for further evaluation.  STUDIES:  CTA Chest 07/27/15 (personally reviewed by me) - No PE. Moderate right pleural effusion with peripheral rind of enhancement. Right lower lobe consolidation. Enlarged right hilar & mediastinal lymph nodes. No pericardial effusion. VENOUS DUPLEX 07/28/15 - Extensive LLE DVT TTE 07/30/15 - PENDING  SIGNIFICANT EVENTS: 11/09 - Foley change 11/13 - Admit to Hospital from [place]: 07/24/15 ABG (on PCV 28/5 0.4 22):  7.42/72/97  06/27/15 CBC: 6.7/8.1/24.8/223 BMP: 138/4.0/96/33/13/0.5/100/8.3 Magnesium: 1.8 LFT: 2.6/6.1/0.7/75/18/29  CULTURES: Right Pleural Fluid 11/15>>> BCx2 11/12>>>Coag Neg Staph 1/2 UC 11/12 - Proteus mirabilis & Pseudomonas aeruginosa Tracheal Asp 11/12 - MRSA  ANTIBIOTICS: Vancomycin 11/12>>> Zosyn 11/12>>> Diflucan via tube 11/2>>> Nystatin Powder 11/2>>>  Right PFA (07/30/15): Glucose - 36 LDH - 2372 Total Protein - <3.0 WBC - 7  HISTORY OF PRESENT ILLNESS:  History was obtained from the patient's wife and my review of his medical records from [place]. He is an unfortunate 74 year old male who suffered a fall and underwent subsequent C-spine surgeries ultimately leading to quadriplegia. The patient previously underwent tracheostomy & PEG tube placement. Per the patient's records fairly recently he was treated for an MRSA pneumonia with vancomycin. Exactly when this was is  unclear from the records. I can discern that his ventilator settings were increased recently/today and according to his wife this was secondary to intermittent hypoxia. She is unsure whether or not he actually had any fever while at the facility. Most notably the patient seem to desaturate with repositioning at times. On arrival in the emergency department the patient was noted to have an elevated lactic acid as well as right lower lobe consolidation with associated pleural effusion that appears to have a rind. Previously the patient was tolerating T bar trials for 12 hours at a time. He has continued to have difficulty with swallowing and was undergoing speech therapy. The patient's records also note of a recent GI bleed.  SUBJECTIVE: No events overnight. Has persistent secretions.  REVIEW OF SYSTEMS: Unable to obtain accurate review of systems the patient remains on ventilator.  VITAL SIGNS: Temp:  [97.1 F (36.2 C)-100.6 F (38.1 C)] 98.6 F (37 C) (11/16 0800) Pulse Rate:  [25-90] 60 (11/16 1000) Resp:  [17-26] 22 (11/16 1000) BP: (93-134)/(59-89) 118/68 mmHg (11/16 1000) SpO2:  [96 %-100 %] 98 % (11/16 1000) FiO2 (%):  [30 %] 30 % (11/16 0841) Weight:  [284 lb 2.8 oz (128.9 kg)] 284 lb 2.8 oz (128.9 kg) (11/16 0400) HEMODYNAMICS:   VENTILATOR SETTINGS: Vent Mode:  [-] PRVC FiO2 (%):  [30 %] 30 % Set Rate:  [17 bmp] 17 bmp Vt Set:  [480 mL] 480 mL PEEP:  [5 cmH20] 5 cmH20 Plateau Pressure:  [23 [id number] cmH20] 24 cmH20 INTAKE / OUTPUT:  Intake/Output Summary (Last 24 hours) at 07/31/15 1135 Last data filed at 07/31/15 1000  Gross per 24 hour  Intake   2253 ml  Output   3275 ml  Net  -1022 ml   PHYSICAL EXAMINATION: General:  Awake. Alert. Wife at bedside. Patient lying in bed with eyes closed Integument: Warm and dry. No rash on exposed skin.  HEENT:  Moist mucus membranes. Tracheostomy in place. No scleral injection. Cardiovascular:  Regular rate. Bilateral lower extremity  edema. No appreciable JVD given body habitus.  Pulmonary:  Coarse breath sounds bilaterally on ventilator. Symmetric chest wall rise on ventilator.  Abdomen: Soft. Normal bowel sounds. Nondistended. PEG tube in place.  LABS:  CBC  Recent Labs Lab 07/29/15 1200 07/30/15 0535 07/31/15 0355  WBC 10.7* 10.6* 9.5  HGB 7.4* 7.6* 8.1*  HCT 25.4* 26.4* 28.5*  PLT 281 349 338   Coag's No results for input(s): APTT, INR in the last 168 hours. BMET  Recent Labs Lab 07/28/15 2345 07/29/15 0157 07/30/15 0535 07/30/15 1245  NA 143 143 143  --   K 2.6* 2.8* 2.8* 3.2*  CL 102 104 107  --   CO2 28 29 25   --   BUN 18 17 16   --   CREATININE 0.54* 0.55* 0.43*  --   GLUCOSE 102* 131* 113*  --    Electrolytes  Recent Labs Lab 07/28/15 0254 07/28/15 0334 07/28/15 2345 07/29/15 0157 07/30/15 0535 07/30/15 1245  CALCIUM 8.1*  --  8.4* 8.2* 8.2*  --   MG  --  2.6* 1.6*  --   --  1.6*  PHOS 1.8*  --  4.9*  --   --   --    Sepsis Markers  Recent Labs Lab 07/27/15 1701 07/27/15 2058 07/27/15 2246 07/28/15 0334 07/28/15 2345 07/30/15 0535  LATICACIDVEN 3.12* 3.32*  --  0.6  --   --   PROCALCITON  --   --  2.40  --  2.47 2.40   ABG  Recent Labs Lab 07/27/15 1706 07/28/15 0440 07/28/15 0630  PHART 7.556* 7.626* 7.490*  PCO2ART 42.3 29.3* 41.3  PO2ART 135.0* 115* 81.9   Liver Enzymes  Recent Labs Lab 07/27/15 1630 07/28/15 0254 07/30/15 0535  AST 38  --  19  ALT 22  --  14*  ALKPHOS 64  --  46  BILITOT 0.6  --  0.6  ALBUMIN 2.1* 1.9* 1.8*   Cardiac Enzymes No results for input(s): TROPONINI, PROBNP in the last 168 hours. Glucose  Recent Labs Lab 07/30/15 0753 07/30/15 1225 07/30/15 1659 07/30/15 2036 07/31/15 0057 07/31/15 0533  GLUCAP 131* 132* 112* 119* 116* 111*    Imaging Dg Chest Port 1 View  07/30/2015  CLINICAL DATA:  Post right-sided thoracentesis EXAM: PORTABLE CHEST 1 VIEW COMPARISON:  07/27/2015; chest CT - 07/27/2015 FINDINGS:  Grossly unchanged enlarged cardiac silhouette and mediastinal contours. Interval removal of right upper extremity approach PICC line. Otherwise, stable position of support apparatus. Grossly unchanged small partially loculated right-sided effusion post thoracentesis. No pneumothorax. Right basilar heterogeneous/consolidative opacities are unchanged. Mild pulmonary congestion without frank evidence of edema. Unchanged bones including lower cervical ACDF and paraspinal fusion, incompletely evaluated. IMPRESSION: 1. No evidence of pneumothorax following right-sided thoracentesis. 2. Grossly unchanged small partially loculated right-sided effusion with associated right medial basilar heterogeneous/consolidative opacities again worrisome for infection. 3. Pulmonary venous congestion without frank evidence of edema. 4. Interval removal of right upper extremity approach PICC line. Electronically Signed   By: Sandi Mariscal M.D.   On: 07/30/2015 15:58   US Thoracentesis Asp Pleural Space W/img Guide  07/30/2015  INDICATION: Patient with history of respiratory failure on ventilator, pneumonia, right pleural effusion. Request is made for diagnostic right thoracentesis. EXAM: ULTRASOUND GUIDED  DIAGNOSTIC RIGHT THORACENTESIS COMPARISON:  None. MEDICATIONS: None COMPLICATIONS: None immediate TECHNIQUE: Informed written consent was obtained from the patient's wife after a discussion of the risks, benefits and alternatives to treatment. A timeout was performed prior to the initiation of the procedure. Initial ultrasound scanning demonstrates a small complex right pleural effusion. The lower chest was prepped and draped in the usual sterile fashion. 1% lidocaine was used for local anesthesia. Under direct ultrasound guidance, a 19 gauge, 10-cm, Yueh catheter was introduced. An ultrasound image was saved for documentation purposes. The thoracentesis was performed. The catheter was removed and a dressing was applied. The patient  tolerated the procedure well without immediate post procedural complication. The patient was escorted to have an upright chest radiograph. FINDINGS: A total of approximately 20 cc of clear, yellow fluid was removed. Requested samples were sent to the laboratory. The pleural fluid collection was complex/multi-septated. IMPRESSION: Successful ultrasound-guided diagnostic right sided thoracentesis yielding 20 cc of pleural fluid. Read by: Rowe Robert, PA-C Electronically Signed   By: Sandi Mariscal M.D.   On: 07/30/2015 15:55   ASSESSMENT / PLAN:  74 year old male with acute on chronic hypoxic respiratory failure status post prior tracheostomy & PEG tube placement. Patient has a right pleural effusion that underwent thoracentesis by interventional radiology showing exudate by LDH only.  Cultures are pending. Patient does have a multi-organism UTI.  With continued copious secretions tracheostomy trials with collar are impossible.  1. Acute on chronic hypoxic respiratory failure: Status post Shiley #6 Cuffed Prox XLT Trach (from outside) placement. Continuing to wean FiO2 for saturation greater than 94%. Continuing treatment of underlying MRSA pneumonia. Continuing to nebs every 6 hours for airway clearance. Continuing to hold on tracheostomy collar & T bar trials for now. 2. MRSA pneumonia: Continuing on treatment with Vancomycin Day #5/14. 3. Chronic hypercarbic respiratory failure: Continuing Duonebs. No evidence for acute decompensation. 4. Exudative right pleural effusion:  Awaiting culture result. 5. Left lower extremity DVT: Continuing on heparin drip for systemic anticoagulation per pharmacy protocol.  Sonia Baller Ashok Cordia, M.D. [facility] Pulmonary & Critical Care Pager:  [phone] After 3pm or if no response, call [phone] 07/31/2015, 11:35 AM

## 2015-07-31 NOTE — Progress Notes (Signed)
[place] TEAM 1 - Stepdown/ICU TEAM PROGRESS NOTE  Kaigen Sperandio [id number] DOB: 1941-09-04 DOA: 07/27/2015 PCP: Verneita Griffes, MD  Admit HPI / Brief Narrative: 74 year old male with quadriplegia status post fall & C-spine surgery. Patient treated for recent MRSA pneumonia. He was having repeated desaturations while on ventilator at [facility], sometimes with simple repositioning. Chest x-ray was concerning for persistent pneumonia & patient was transferred to the ED at [facility] for further evaluation.  Significant Events: 11/09 - Foley change 11/12 - CTA Chest - No PE. Moderate right pleural effusion with peripheral rind of enhancement. Right lower lobe consolidation. Enlarged right hilar & mediastinal lymph nodes. No pericardial effusion. 11/13 - Admit to Hospital from [place] 11/13 - venous duplex - extensive L LE DVT  HPI/Subjective: The patient is resting comfortably at the time of my exam.  His wife is at the bedside.  She states that he has been sleeping for most the day.  She has not noted any new issues with him today.  Assessment/Plan:  Acute on chronic hypoxic respiratory failure w/ chronic hypercarbic respiratory failure Ventilator management per PCCM - appears stable at this time  Sepsis secondary to MRSA HCAP  recently treated with Vancomycin for MRSA pneumonia - tracheal aspirate reveals persistent MRSA - vancomycin continues  Proteus UTI  Urine culture remarkable for greater than 100,000 colonies Proteus and a low colony count of Pseudomonas  - I suspect the Proteus is a pathogen in the Pseudomonas is simply a colonizer in this patient with indwelling Foley - direct antibiotic at Proteus and follow   1/2 blood cx positive coagulase negative staph  Suspicious for contamination - nonetheless will be appropriately covered with antibiotic being used for MRSA pneumonia   Right Pleural Effusion trapped lung versus parapneumonic effusion - PCCM to consider TCTS evaluation    Trach dependent  Previously doing 12hr T bar trials - vent and trach care per PCCM  COPD No evidence of acute exacerbation this time  Chronic hypotension treated with Midodrine - blood pressure stable   L LE DVT (groin to knee) Noted on duplex - unclear chronicity - IV heparin   H/O Atrial fibrillation Normal sinus rhythm presently  H/O Ventricular tachycardia  Dyslipidemia  Hypokalemia  Recheck in AM  Hypomagnesemia  Recheck in AM  Chronic foley due to Neurogenic bladder Foley changed 11/9 per outside records - changed again 11/13 due to clogging   Chronic Dysphagia - PEG dependent  SLP confirms need for NPO status - continue to utilize PEG for meds and feeds   Anemia - normocytic No evidence of significant acute blood loss - follow trend  DM2 CBG controlled at this time  Quadriplegia   Sacral decub WOC has evaluated - suspicion of possible fistulas - investigate further as pt stabilizes   Obesity - Body mass index is 37.5 kg/(m^2).   MRSA screen +  Code Status: FULL Family Communication: Spoke with wife at bedside Disposition Plan: SDU  Consultants: PCCM  Antibiotics: Vanc 11/12 > Zosyn 11/12 > 11/16 Rocephin 11/16 >  DVT prophylaxis: IV Heparin   Objective: Blood pressure 134/78, pulse 80, temperature 98.6 F (37 C), temperature source Oral, resp. rate 20, height 6\' 1"  (1.854 m), weight 128.9 kg (284 lb 2.8 oz), SpO2 100 %.  Intake/Output Summary (Last 24 hours) at 07/31/15 0852 Last data filed at 07/31/15 0656  Gross per 24 hour  Intake   1953 ml  Output   3275 ml  Net  -1322 ml   Exam:  General: No acute respiratory distress Lungs: clear to auscultation throughout without wheeze Cardiovascular: Regular rate and rhythm without murmur gallop or rub Abdomen: Nondistended, soft, bowel sounds positive, no rebound, no ascites, no appreciable mass - PEG insertion clean/dry Extremities: No significant cyanosis, clubbing;  1+ edema bilateral  lower extremities  Data Reviewed: Basic Metabolic Panel:  Recent Labs Lab 07/27/15 1630 07/28/15 0253 07/28/15 0254 07/28/15 0334 07/28/15 2345 07/29/15 0157 07/30/15 0535 07/30/15 1245  NA 140  --  139  --  143 143 143  --   K 3.4*  --  4.2  --  2.6* 2.8* 2.8* 3.2*  CL 95*  --  97*  --  102 104 107  --   CO2 34*  --  31  --  28 29 25   --   GLUCOSE 218*  --  90  --  102* 131* 113*  --   BUN 28*  --  25*  --  18 17 16   --   CREATININE 0.72  --  0.66  --  0.54* 0.55* 0.43*  --   CALCIUM 8.7*  --  8.1*  --  8.4* 8.2* 8.2*  --   MG  --  1.7  --  2.6* 1.6*  --   --  1.6*  PHOS  --   --  1.8*  --  4.9*  --   --   --     CBC:  Recent Labs Lab 07/27/15 1630 07/28/15 0334 07/28/15 0451 07/28/15 2345 07/29/15 1200 07/30/15 0535 07/31/15 0355  WBC 12.7* 7.8  --  13.1* 10.7* 10.6* 9.5  NEUTROABS 11.4* 5.7  --   --   --   --   --   HGB 8.9* 9.9*  --  8.3* 7.4* 7.6* 8.1*  HCT 29.1* 32.5* 24.4* 27.1* 25.4* 26.4* 28.5*  MCV 89.0 100.0  --  90.9 91.7 90.7 91.1  PLT 280 200  --  330 281 349 338    Liver Function Tests:  Recent Labs Lab 07/27/15 1630 07/28/15 0254 07/30/15 0535  AST 38  --  19  ALT 22  --  14*  ALKPHOS 64  --  46  BILITOT 0.6  --  0.6  PROT 7.2  --  5.9*  ALBUMIN 2.1* 1.9* 1.8*   CBG:  Recent Labs Lab 07/30/15 1225 07/30/15 1659 07/30/15 2036 07/31/15 0057 07/31/15 0533  GLUCAP 132* 112* 119* 116* 111*    Recent Results (from the past 240 hour(s))  Blood Culture (routine x 2)     Status: None (Preliminary result)   Collection Time: 07/27/15  4:30 PM  Result Value Ref Range Status   Specimen Description BLOOD LEFT HAND  Final   Special Requests BOTTLES DRAWN AEROBIC AND ANAEROBIC 5CC  Final   Culture NO GROWTH 3 DAYS  Final   Report Status PENDING  Incomplete  Blood Culture (routine x 2)     Status: None   Collection Time: 07/27/15  4:50 PM  Result Value Ref Range Status   Specimen Description BLOOD LEFT HAND  Final   Special Requests  IN PEDIATRIC BOTTLE 2CC  Final   Culture  Setup Time   Final    GRAM POSITIVE COCCI IN CLUSTERS AEROBIC BOTTLE ONLY CRITICAL RESULT CALLED TO, READ BACK BY AND VERIFIED WITH: P KEATON,RN AT 1411 07/28/15 BY L BENFIELD    Culture   Final    STAPHYLOCOCCUS SPECIES (COAGULASE NEGATIVE) THE SIGNIFICANCE OF ISOLATING THIS ORGANISM FROM A SINGLE VENIPUNCTURE  CANNOT BE PREDICTED WITHOUT FURTHER CLINICAL AND CULTURE CORRELATION. SUSCEPTIBILITIES AVAILABLE ONLY ON REQUEST.    Report Status 07/30/2015 FINAL  Final  Urine culture     Status: None (Preliminary result)   Collection Time: 07/27/15  4:50 PM  Result Value Ref Range Status   Specimen Description URINE, RANDOM  Final   Special Requests NONE  Final   Culture >=100,000 COLONIES/mL GRAM NEGATIVE RODS  Final   Report Status PENDING  Incomplete  Culture, respiratory (NON-Expectorated)     Status: None   Collection Time: 07/27/15  5:28 PM  Result Value Ref Range Status   Specimen Description TRACHEAL ASPIRATE  Final   Special Requests NONE  Final   Gram Stain   Final    ABUNDANT WBC PRESENT,BOTH PMN AND MONONUCLEAR NO SQUAMOUS EPITHELIAL CELLS SEEN NO ORGANISMS SEEN Performed at [facility]    Culture   Final    MODERATE METHICILLIN RESISTANT STAPHYLOCOCCUS AUREUS Note: RIFAMPIN AND GENTAMICIN SHOULD NOT BE USED AS SINGLE DRUGS FOR TREATMENT OF STAPH INFECTIONS. This organism DOES NOT demonstrate inducible Clindamycin resistance in vitro. CRITICAL RESULT CALLED TO, READ BACK BY AND VERIFIED WITH: NICOLE WALLER AT  [id number] ON 07/30/15 BY WEBSP Performed at [facility]    Report Status 07/30/2015 FINAL  Final   Organism ID, Bacteria METHICILLIN RESISTANT STAPHYLOCOCCUS AUREUS  Final      Susceptibility   Methicillin resistant staphylococcus aureus - MIC*    CLINDAMYCIN <=0.25 SENSITIVE Sensitive     ERYTHROMYCIN >=8 RESISTANT Resistant     GENTAMICIN <=0.5 SENSITIVE Sensitive     LEVOFLOXACIN >=8 RESISTANT Resistant       OXACILLIN >=4 RESISTANT Resistant     RIFAMPIN <=0.5 SENSITIVE Sensitive     TRIMETH/SULFA >=320 RESISTANT Resistant     VANCOMYCIN 1 SENSITIVE Sensitive     TETRACYCLINE <=1 SENSITIVE Sensitive     * MODERATE METHICILLIN RESISTANT STAPHYLOCOCCUS AUREUS  MRSA PCR Screening     Status: Abnormal   Collection Time: 07/28/15  3:23 AM  Result Value Ref Range Status   MRSA by PCR POSITIVE (A) NEGATIVE Final    Comment:        The GeneXpert MRSA Assay (FDA approved for NASAL specimens only), is one component of a comprehensive MRSA colonization surveillance program. It is not intended to diagnose MRSA infection nor to guide or monitor treatment for MRSA infections. RESULT CALLED TO, READ BACK BY AND VERIFIED WITH: SPOKE TO RB PERRIN,J AT 6:32 ON 07/28/2015 [email or web address]   Body fluid culture     Status: None (Preliminary result)   Collection Time: 07/30/15  3:40 PM  Result Value Ref Range Status   Specimen Description FLUID RIGHT PLEURAL  Final   Special Requests NONE  Final   Gram Stain   Final    WBC PRESENT, PREDOMINANTLY MONONUCLEAR NO ORGANISMS SEEN CYTOSPIN    Culture PENDING  Incomplete   Report Status PENDING  Incomplete     Studies:   Recent x-ray studies have been reviewed in detail by the Attending Physician  Scheduled Meds:  Scheduled Meds: . antiseptic oral rinse  7 mL Mouth Rinse QID  . chlorhexidine gluconate  15 mL Mouth/Throat BID  . Chlorhexidine Gluconate Cloth  6 each Topical Q0600  . digoxin  0.125 mg Per Tube Daily  . escitalopram  20 mg Per Tube Daily  . feeding supplement (PRO-STAT SUGAR FREE 64)  60 mL Per Tube 5 X Daily  . feeding supplement (VITAL HIGH PROTEIN)  1,000 mL Per Tube Q24H  . fluconazole  200 mg Per Tube Daily  . folic acid  1 mg Per Tube Daily  . free water  200 mL Per Tube Q4H  . gabapentin  800 mg Per Tube BID  . insulin aspart  0-9 Units Subcutaneous 6 times per day  . ipratropium-albuterol  3 mL Nebulization QID  . metoprolol  tartrate  12.5 mg Per Tube BID  . midodrine  5 mg Per Tube BID WC  . mirtazapine  15 mg Per Tube QHS  . mupirocin ointment  1 application Nasal BID  . nystatin   Topical TID  . pantoprazole sodium  40 mg Per Tube BID  . piperacillin-tazobactam (ZOSYN)  IV  3.375 g Intravenous Q8H  . vancomycin  1,000 mg Intravenous Q12H  . vitamin B-12  250 mcg Per Tube Daily    Time spent on care of this patient: 35 mins   Koraline Phillipson T , MD   [facility] Office  [phone] Pager - Text Page per Shea Evans as per below:  On-Call/Text Page:      [email or web address]      password [email or web address]  If 7PM-7AM, please contact night-coverage [email or web address] Password [email or web address] 07/31/2015, 8:52 AM   LOS: 3 days

## 2015-08-01 ENCOUNTER — Inpatient Hospital Stay (HOSPITAL_COMMUNITY): Payer: Medicare Other

## 2015-08-01 DIAGNOSIS — N390 Urinary tract infection, site not specified: Secondary | ICD-10-CM

## 2015-08-01 DIAGNOSIS — J9621 Acute and chronic respiratory failure with hypoxia: Secondary | ICD-10-CM | POA: Diagnosis present

## 2015-08-01 DIAGNOSIS — E876 Hypokalemia: Secondary | ICD-10-CM

## 2015-08-01 DIAGNOSIS — J9622 Acute and chronic respiratory failure with hypercapnia: Secondary | ICD-10-CM | POA: Diagnosis present

## 2015-08-01 LAB — BASIC METABOLIC PANEL
Anion gap: 6 (ref 5–15)
BUN: 15 mg/dL (ref 6–20)
CHLORIDE: 108 mmol/L (ref 101–111)
CO2: 27 mmol/L (ref 22–32)
CREATININE: 0.45 mg/dL — AB (ref 0.61–1.24)
Calcium: 8.1 mg/dL — ABNORMAL LOW (ref 8.9–10.3)
GFR calc non Af Amer: >60 mL/min (ref >60)
Glucose, Bld: 115 mg/dL — ABNORMAL HIGH (ref 65–99)
Potassium: 2.8 mmol/L — ABNORMAL LOW (ref 3.5–5.1)
Sodium: 141 mmol/L (ref 135–145)

## 2015-08-01 LAB — CBC
HEMATOCRIT: 26.8 % — AB (ref 39.0–52.0)
HEMOGLOBIN: 7.7 g/dL — AB (ref 13.0–17.0)
MCH: 26.1 pg (ref 26.0–34.0)
MCHC: 28.7 g/dL — AB (ref 30.0–36.0)
MCV: 90.8 fL (ref 78.0–100.0)
Platelets: 325 10*3/uL (ref 150–400)
RBC: 2.95 MIL/uL — ABNORMAL LOW (ref 4.22–5.81)
RDW: 17.1 % — ABNORMAL HIGH (ref 11.5–15.5)
WBC: 10 10*3/uL (ref 4.0–10.5)

## 2015-08-01 LAB — GLUCOSE, CAPILLARY
GLUCOSE-CAPILLARY: 104 mg/dL — AB (ref 65–99)
GLUCOSE-CAPILLARY: 110 mg/dL — AB (ref 65–99)
GLUCOSE-CAPILLARY: 119 mg/dL — AB (ref 65–99)
GLUCOSE-CAPILLARY: 127 mg/dL — AB (ref 65–99)
Glucose-Capillary: 106 mg/dL — ABNORMAL HIGH (ref 65–99)
Glucose-Capillary: 123 mg/dL — ABNORMAL HIGH (ref 65–99)

## 2015-08-01 LAB — MAGNESIUM
Magnesium: 1.4 mg/dL — ABNORMAL LOW (ref 1.7–2.4)
Magnesium: 2 mg/dL (ref 1.7–2.4)

## 2015-08-01 LAB — CULTURE, BLOOD (ROUTINE X 2): Culture: NO GROWTH

## 2015-08-01 LAB — HEPARIN LEVEL (UNFRACTIONATED): Heparin Unfractionated: 0.46 IU/mL (ref 0.30–0.70)

## 2015-08-01 LAB — DIGOXIN LEVEL: DIGOXIN LVL: 0.3 ng/mL — AB (ref 0.8–2.0)

## 2015-08-01 LAB — HEMOGLOBIN A1C
Hgb A1c MFr Bld: 6.1 % — ABNORMAL HIGH (ref 4.8–5.6)
Mean Plasma Glucose: 128 mg/dL

## 2015-08-01 LAB — POTASSIUM: Potassium: 2.9 mmol/L — ABNORMAL LOW (ref 3.5–5.1)

## 2015-08-01 MED ORDER — MAGNESIUM SULFATE 50 % IJ SOLN
3.0000 g | Freq: Once | INTRAVENOUS | Status: AC
  Administered 2015-08-01: 3 g via INTRAVENOUS
  Filled 2015-08-01: qty 6

## 2015-08-01 MED ORDER — CEFTRIAXONE SODIUM 1 G IJ SOLR
1.0000 g | INTRAMUSCULAR | Status: DC
  Administered 2015-08-01 – 2015-08-02 (×2): 1 g via INTRAVENOUS
  Filled 2015-08-01 (×2): qty 10

## 2015-08-01 MED ORDER — LIDOCAINE HCL 1 % IJ SOLN
INTRAMUSCULAR | Status: AC
  Filled 2015-08-01: qty 20

## 2015-08-01 MED ORDER — POTASSIUM CHLORIDE 10 MEQ/100ML IV SOLN
10.0000 meq | INTRAVENOUS | Status: AC
  Administered 2015-08-01 (×5): 10 meq via INTRAVENOUS
  Filled 2015-08-01 (×4): qty 100

## 2015-08-01 NOTE — Trach Care Team (Signed)
[facility] Progression Note   Patient Details Name: Thomas Zuniga MRN: [id number] DOB: Jul 16, 1941 Today's Date: 08/01/2015   Tracheostomy Assessment    Tracheostomy (Active)  Status Secured 08/01/2015 11:57 AM  Site Assessment Clean;Dry 08/01/2015 11:57 AM  Site Care Cleansed;Dried;Dressing applied 08/01/2015  6:55 AM  Inner Cannula Care Changed/new 08/01/2015  6:55 AM  Ties Assessment Clean;Dry;Secure 08/01/2015 11:57 AM  Cuff pressure (cm) 25 cm 07/31/2015  3:26 AM  Emergency Equipment at bedside Yes 08/01/2015 11:57 AM     Care Needs     Respiratory Therapy O2 Device: Ventilator FiO2 (%): 30 % SpO2: 99 %    Speech Language Pathology  Patient may use Passy-Muir Speech Valve: with SLP only PMSV Supervision: Full Follow up Recommendations: LTACH   Physical Therapy      Occupational Therapy      Nutritional Patient's Current Diet: Tube feeding Tube Feeding: Vital High Protein Tube Feeding Frequency: Continuous Tube Feeding Strength: Full strength    Case Management/Social Work      Clinical biochemist Care Team/Provider Recommendations [facility] Team Members Present-  Doris Cheadle and Andre Lefort, RT,     Chronic Trach from [place]. Remains on full vent support. Continue to follow.           Thomas Zuniga, Thomas Zuniga 08/01/2015, 2:35 PM  Scribe for team

## 2015-08-01 NOTE — Progress Notes (Signed)
PULMONARY / CRITICAL CARE MEDICINE   Name: Thomas Zuniga MRN: [id number] DOB: 02/04/1941    ADMISSION DATE:  07/27/2015 CONSULTATION DATE:  07/28/2015  REFERRING MD :  EDP  CHIEF COMPLAINT:  HCAP  INITIAL PRESENTATION:  74 year old male with quadriplegia status post fall & C-spine surgery. Patient treated for recent MRSA pneumonia. He was having repeated desaturations while on ventilator sometimes with simple repositioning. Chest x-ray was concerning for persistent pneumonia & patient was transferred to the emergency department for further evaluation.  STUDIES:  CTA Chest 07/27/15 (personally reviewed by me) - No PE. Moderate right pleural effusion with peripheral rind of enhancement. Right lower lobe consolidation. Enlarged right hilar & mediastinal lymph nodes. No pericardial effusion. VENOUS DUPLEX 07/28/15 - Extensive LLE DVT TTE 07/30/15 - LV normal in size with EF 55-60%. No wall motion abnormalities. Unable to assess diastolic function. LA severely dilated. RA normal in size. RV mildly dilated with normal systolic function. No aortic stenosis or regurgitation. Trivial mitral regurgitation without stenosis. No pulmonic stenosis. Mild tricuspid regurgitation. No pericardial effusion.   SIGNIFICANT EVENTS: 11/09 - Foley change 11/13 - Admit to Hospital from [place]: 07/24/15 ABG (on PCV 28/5 0.4 22):  7.42/72/97  06/27/15 CBC: 6.7/8.1/24.8/223 BMP: 138/4.0/96/33/13/0.5/100/8.3 Magnesium: 1.8 LFT: 2.6/6.1/0.7/75/18/29  CULTURES: Right Pleural Fluid 11/15>>> BCx2 11/12>>>Coag Neg Staph 1/2 UC 11/12 - Proteus mirabilis & Pseudomonas aeruginosa Tracheal Asp 11/12 - MRSA  ANTIBIOTICS: Ceftriaxone 11/16>>> Vancomycin 11/12>>> Diflucan via tube 11/2>>> Nystatin Powder 11/2>>>  Zosyn 11/12 - 11/16  Right PFA (07/30/15): Glucose - 36 LDH - 2372 Total Protein - <3.0 WBC - 7  HISTORY OF PRESENT ILLNESS:  History was obtained from the patient's wife and my  review of his medical records from [place]. He is an unfortunate 74 year old male who suffered a fall and underwent subsequent C-spine surgeries ultimately leading to quadriplegia. The patient previously underwent tracheostomy & PEG tube placement. Per the patient's records fairly recently he was treated for an MRSA pneumonia with vancomycin. Exactly when this was is unclear from the records. I can discern that his ventilator settings were increased recently/today and according to his wife this was secondary to intermittent hypoxia. She is unsure whether or not he actually had any fever while at the facility. Most notably the patient seem to desaturate with repositioning at times. On arrival in the emergency department the patient was noted to have an elevated lactic acid as well as right lower lobe consolidation with associated pleural effusion that appears to have a rind. Previously the patient was tolerating T bar trials for 12 hours at a time. He has continued to have difficulty with swallowing and was undergoing speech therapy. The patient's records also note of a recent GI bleed.  SUBJECTIVE: Wife reports no acute events overnight.  Patient reports no pain or difficulty breathing with a nod. Secretion consistency improving somewhat. Patient switched from Zosyn to Rocephin yesterday given culture results.  REVIEW OF SYSTEMS: Unable to obtain accurate review of systems the patient remains on ventilator.  VITAL SIGNS: Temp:  [98.1 F (36.7 C)-99.4 F (37.4 C)] 98.1 F (36.7 C) (11/17 0736) Pulse Rate:  [60-84] 63 (11/17 0736) Resp:  [18-22] 20 (11/17 0736) BP: (111-153)/(68-90) 153/85 mmHg (11/17 0736) SpO2:  [98 %-100 %] 100 % (11/17 0908) FiO2 (%):  [30 %] 30 % (11/17 0908) Weight:  [287 lb 11.2 oz (130.5 kg)] 287 lb 11.2 oz (130.5 kg) (11/17 0500) HEMODYNAMICS:   VENTILATOR SETTINGS: Vent Mode:  [-] PRVC FiO2 (%):  [  30 %] 30 % Set Rate:  [17 bmp] 17 bmp Vt Set:  [480 mL] 480 mL PEEP:   [5 cmH20] 5 cmH20 Plateau Pressure:  [22 cmH20-23 cmH20] 23 cmH20 INTAKE / OUTPUT:  Intake/Output Summary (Last 24 hours) at 08/01/15 [id number] Last data filed at 08/01/15 0600  Gross per 24 hour  Intake 1128.5 ml  Output   2750 ml  Net -1621.5 ml   PHYSICAL EXAMINATION: General:  Sleeping until awoken. Wife at bedslaying on his right side.  Integument: Warm and dry. No rash on exposed skin. Right upper extremity PICC removed. HEENT:  Moist mucus membranes. Tracheostomy in place. No scleral injection. Cardiovascular:  Regular rate. Bilateral lower extremity edema persists. No appreciable JVD given body habitus.  Pulmonary:  Coarse breath sounds bilaterally on ventilator. Symmetric chest wall rise on ventilator. Secretions now mostly clear to tan but still copious. Abdomen: Soft. Normal bowel sounds. Nondistended. PEG tube in place.  LABS:  CBC  Recent Labs Lab 07/30/15 0535 07/31/15 0355 08/01/15 0324  WBC 10.6* 9.5 10.0  HGB 7.6* 8.1* 7.7*  HCT 26.4* 28.5* 26.8*  PLT 349 338 325   Coag's No results for input(s): APTT, INR in the last 168 hours. BMET  Recent Labs Lab 07/29/15 0157 07/30/15 0535 07/30/15 1245 08/01/15 0324  NA 143 143  --  141  K 2.8* 2.8* 3.2* 2.8*  CL 104 107  --  108  CO2 29 25  --  27  BUN 17 16  --  15  CREATININE 0.55* 0.43*  --  0.45*  GLUCOSE 131* 113*  --  115*   Electrolytes  Recent Labs Lab 07/28/15 0254  07/28/15 2345 07/29/15 0157 07/30/15 0535 07/30/15 1245 08/01/15 0324  CALCIUM 8.1*  --  8.4* 8.2* 8.2*  --  8.1*  MG  --   < > 1.6*  --   --  1.6* 1.4*  PHOS 1.8*  --  4.9*  --   --   --   --   < > = values in this interval not displayed. Sepsis Markers  Recent Labs Lab 07/27/15 1701 07/27/15 2058 07/27/15 2246 07/28/15 0334 07/28/15 2345 07/30/15 0535  LATICACIDVEN 3.12* 3.32*  --  0.6  --   --   PROCALCITON  --   --  2.40  --  2.47 2.40   ABG  Recent Labs Lab 07/27/15 1706 07/28/15 0440 07/28/15 0630  PHART  7.556* 7.626* 7.490*  PCO2ART 42.3 29.3* 41.3  PO2ART 135.0* 115* 81.9   Liver Enzymes  Recent Labs Lab 07/27/15 1630 07/28/15 0254 07/30/15 0535  AST 38  --  19  ALT 22  --  14*  ALKPHOS 64  --  46  BILITOT 0.6  --  0.6  ALBUMIN 2.1* 1.9* 1.8*   Cardiac Enzymes No results for input(s): TROPONINI, PROBNP in the last 168 hours. Glucose  Recent Labs Lab 07/31/15 1647 07/31/15 1906 07/31/15 1948 08/01/15 0040 08/01/15 0504 08/01/15 0739  GLUCAP 112* 127* 106* 110* 106* 119*    Imaging No results found. ASSESSMENT / PLAN:  74 year old male with acute on chronic hypoxic respiratory failure status post prior tracheostomy & PEG tube placement. Patient has a right pleural effusion that underwent thoracentesis by interventional radiology showing exudate by LDH only.  Cultures are pending. Antibiotics adjusted yesterday to Rocephin from Zosyn. With improvement in the consistency of his secretions we can likely proceed with pressure support trials but I would not try T-collar or T-bar trials yet.  1. Acute on chronic hypoxic respiratory failure: Status post Shiley #6 Cuffed Prox XLT Trach (from outside) placement. Continuing to wean FiO2 for saturation greater than 94%. Continuing treatment of underlying MRSA pneumonia. Continuing to nebs every 6 hours for airway clearance. PS trials today. 2. MRSA pneumonia: Continuing on treatment with Vancomycin Day #6/14. 3. Chronic hypercarbic respiratory failure: Continuing Duonebs. No evidence for acute decompensation. 4. Exudative right pleural effusion:  Awaiting culture result which is pending. 5. Left lower extremity DVT: Continuing on heparin drip for systemic anticoagulation per pharmacy protocol. 6. Anemia: No signs of active bleeding. Hemoglobin stable. 7. Hypokalemia: Defer to primary service and replacement. 8. Hypomagnesemia: Defer to primary service and replacement. 9. Proteus & Pseudomonas UTI: Currently on Rocephin & vancomycin.  Managed per primary service.  Sonia Baller Ashok Cordia, M.D. [facility] Pulmonary & Critical Care Pager:  [phone] After 3pm or if no response, call [phone] 08/01/2015, 9:09 AM

## 2015-08-01 NOTE — Progress Notes (Signed)
Pt transferred to and from IR and vent with no complications.

## 2015-08-01 NOTE — Progress Notes (Signed)
RT assisted speech therapist for PMV trials with pt while on the vent.  Pilot balloon deflated for trial, pt tolerated fairly well, vitals remained stable.  No apparent complications. Pilot balloon re inflated and pt on same vent settings.  RT will continue to monitor.

## 2015-08-01 NOTE — Progress Notes (Signed)
08/01/2015- Respiratory care note- Pt assessed as per trach protocol.  Bilateral breath sounds noted-  PT on full ventilator support.  Sats 100% on 30%.  All trach care supplies at bedside with ambu bag and obturator available.  No issues noted.  Pt continues to have an extra long proximal cuffed trach in place.  Will cont to follow progress.

## 2015-08-01 NOTE — Progress Notes (Signed)
[place] TEAM 1 - Stepdown/ICU TEAM Progress Note  Thomas Zuniga [id number] DOB: 1941/08/04 DOA: 07/27/2015 PCP: Verneita Griffes, MD  Admit HPI / Brief Narrative: 74 year old WM male PMHx Depression, DM Type 2 without complication, Atrial fib, HTN, Ventricular Tachycardia, S/P Pacemaker COPD, Chronic Respiratory Failure, Vent Dependent, S/P tracheostomy HLD, with Quadriplegia S/P S/P  fall & C-spine surgery.   Patient treated for recent MRSA pneumonia. He was having repeated desaturations while on ventilator at [facility], sometimes with simple repositioning. Chest x-ray was concerning for persistent pneumonia & patient was transferred to the ED at [facility] for further evaluation.   HPI/Subjective: 11/17 alert follows commands currently unable to answer questions while on vent.   Assessment/Plan: Acute on chronic hypoxic respiratory failure w/ chronic hypercarbic respiratory failure -Ventilator management per PCCM -Status post Shiley #6 Cuffed Prox XLT Trach (from outside) placement  Sepsis secondary to HCAP + UTI v/s other  -Recently treated for MRSA pneumonia -11/12 trach aspirate positive MRSA Continuing on treatment with Vancomycin Day #6/14. -11/17 Have again requested IR placement of PICC line for extended antibiotic use  Lymphadenopathy -Considering patient's pathologically  Enlarged right hilar & mediastinal lymph nodes, will require thoracentesis for further evaluation  -IR thoracentesis; nondiagnostic -Have spoken with [facility] Dr. Ashok Cordia and will wait until resolution of MRSA pneumonia to determine if/when BAL required for definitive diagnosis  Right Pleural Effusion -trapped lung versus parapneumonic effusion -IR thoracentesis; nondiagnostic see results below  - PCCM to consider TCTS evaluation;    Trach dependent  -Previously doing 12hr T bar trials - vent and trach care per PCCM  COPD -No evidence of acute exacerbation this time  Chronic hypotension -treated with  Midodrine 5 mg BID   UTI (positive Proteus Mirabilis/Pseudomonas Aeruginosa) -Antibiotics switched to ceftriaxone, Zosyn discontinued  -Chronic foley  1/2 blood cx positive coag negative staph  -Most likely contamination   L LE DVT (groin to knee) -Noted on duplex - unclear chronicity - IV heparin continues  H/O Atrial fibrillation Normal sinus rhythm presently  H/O Ventricular tachycardia  Dyslipidemia  Hypokalemia -Potassium IV 50 mEq -Recheck potassium at 1300  Hypomagnesemia  -Magnesium 3 gm -Recheck magnesium 1300   Chronic foley due to Neurogenic bladder -Foley changed 11/9 per outside records - changed again 11/13 due to clogging   Chronic Dysphagia - PEG dependent  -SLP evaluation pending - continue to utilize PEG for meds and feeds at this time  Anemia - normocytic -No evidence of significant acute blood loss - follow trend  DM2 controlled -CBG well controlled at this time -11/16 Hemoglobin A1c= 6.1  Quadriplegia   Sacral decub WOC to see   Code Status: FULL Family Communication: Wife present at time of exam Disposition Plan: SNF   Consultants: Dr.Wesam Kathryne Sharper PCCM    Procedure/Significant Events: 11/09 - Foley change 11/12 - CTA Chest - No PE.  -Moderate right pleural effusion with peripheral rind of enhancement./Right lower lobe consolidation.  -Enlarged right hilar & mediastinal lymph nodes.  11/13 - Admit to Hospital from [facility] 11/13 - venous duplex - extensive L LE DVT 11/15 right pleural fluid NGTD   Culture 11/12 blood left hand NGTD 11/12 blood left hand positive coag negative staph 11/12 urine positive Proteus Mirabilis/Pseudomonas aeruginosa 11/12 trach aspirate positive MRSA  11/15 Pathology on right pleural fluid;NON-DIAGNOSTIC ACELLULAR DEBRIS   Antibiotics: Zosyn 11/12 >> 11/16 Vanc 11/12 >> Fluconazole 11/13>> Ceftriaxone 11/17>>  DVT prophylaxis: IV heparin   Devices Tracheostomy   LINES / TUBES:  Continuous Infusions: . sodium chloride 75 mL/hr at 07/31/15 1155  . heparin 1,850 Units/hr (08/01/15 0159)    Objective: VITAL SIGNS: Temp: 97.8 F (36.6 C) (11/17 1100) Temp Source: Axillary (11/17 1100) BP: 103/66 mmHg (11/17 1100) Pulse Rate: 88 (11/17 1100) SPO2; FIO2:   Intake/Output Summary (Last 24 hours) at 08/01/15 1330 Last data filed at 08/01/15 1111  Gross per 24 hour  Intake  900.5 ml  Output   2850 ml  Net -1949.5 ml     Exam: General:  alert follows commands , difficult but patient able to make himself understood concerning questions on his treatment, NAD, chronic respiratory distress Eyes: Negative headache,negative scleral hemorrhage ENT: Negative Runny nose, negative gingival bleeding, Neck:  Negative scars, masses, torticollis, lymphadenopathy, JVD, trach in place, no signs of infection Lungs: coarse breath sounds with rhonchi Lt > Rt, negative wheezes or crackles, thick yellow secretions suctioned Cardiovascular: Regular rate and rhythm without murmur gallop or rub normal S1 and S2 Abdomen: Morbidly obese, nondistended, positive soft, bowel sounds, no rebound, no ascites, no appreciable mass, PEG tube present negative sign of infection Extremities: No significant cyanosis, clubbing, or edema bilateral lower extremities Psychiatric:  Negative depression, negative anxiety, negative fatigue, negative mania Neurologic:  Cranial nerves II through XII intact, negative receptive aphasia.   Data Reviewed: Basic Metabolic Panel:  Recent Labs Lab 07/28/15 0253 07/28/15 0254 07/28/15 0334 07/28/15 2345 07/29/15 0157 07/30/15 0535 07/30/15 1245 08/01/15 0324  NA  --  139  --  143 143 143  --  141  K  --  4.2  --  2.6* 2.8* 2.8* 3.2* 2.8*  CL  --  97*  --  102 104 107  --  108  CO2  --  31  --  28 29 25   --  27  GLUCOSE  --  90  --  102* 131* 113*  --  115*  BUN  --  25*  --  18 17 16   --  15  CREATININE  --  0.66  --  0.54* 0.55* 0.43*  --   0.45*  CALCIUM  --  8.1*  --  8.4* 8.2* 8.2*  --  8.1*  MG 1.7  --  2.6* 1.6*  --   --  1.6* 1.4*  PHOS  --  1.8*  --  4.9*  --   --   --   --    Liver Function Tests:  Recent Labs Lab 07/27/15 1630 07/28/15 0254 07/30/15 0535  AST 38  --  19  ALT 22  --  14*  ALKPHOS 64  --  46  BILITOT 0.6  --  0.6  PROT 7.2  --  5.9*  ALBUMIN 2.1* 1.9* 1.8*   No results for input(s): LIPASE, AMYLASE in the last 168 hours. No results for input(s): AMMONIA in the last 168 hours. CBC:  Recent Labs Lab 07/27/15 1630 07/28/15 0334  07/28/15 2345 07/29/15 1200 07/30/15 0535 07/31/15 0355 08/01/15 0324  WBC 12.7* 7.8  --  13.1* 10.7* 10.6* 9.5 10.0  NEUTROABS 11.4* 5.7  --   --   --   --   --   --   HGB 8.9* 9.9*  --  8.3* 7.4* 7.6* 8.1* 7.7*  HCT 29.1* 32.5*  < > 27.1* 25.4* 26.4* 28.5* 26.8*  MCV 89.0 100.0  --  90.9 91.7 90.7 91.1 90.8  PLT 280 200  --  330 281 349 338 325  < > = values in  this interval not displayed. Cardiac Enzymes: No results for input(s): CKTOTAL, CKMB, CKMBINDEX, TROPONINI in the last 168 hours. BNP (last 3 results)  Recent Labs  07/27/15 1630  BNP 481.5*    ProBNP (last 3 results) No results for input(s): PROBNP in the last 8760 hours.  CBG:  Recent Labs Lab 07/31/15 1948 08/01/15 0040 08/01/15 0504 08/01/15 0739 08/01/15 1147  GLUCAP 106* 110* 106* 119* 123*    Recent Results (from the past 240 hour(s))  Blood Culture (routine x 2)     Status: None (Preliminary result)   Collection Time: 07/27/15  4:30 PM  Result Value Ref Range Status   Specimen Description BLOOD LEFT HAND  Final   Special Requests BOTTLES DRAWN AEROBIC AND ANAEROBIC 5CC  Final   Culture NO GROWTH 4 DAYS  Final   Report Status PENDING  Incomplete  Blood Culture (routine x 2)     Status: None   Collection Time: 07/27/15  4:50 PM  Result Value Ref Range Status   Specimen Description BLOOD LEFT HAND  Final   Special Requests IN PEDIATRIC BOTTLE 2CC  Final   Culture   Setup Time   Final    GRAM POSITIVE COCCI IN CLUSTERS AEROBIC BOTTLE ONLY CRITICAL RESULT CALLED TO, READ BACK BY AND VERIFIED WITH: P KEATON,RN AT [id number] 07/28/15 BY L BENFIELD    Culture   Final    STAPHYLOCOCCUS SPECIES (COAGULASE NEGATIVE) THE SIGNIFICANCE OF ISOLATING THIS ORGANISM FROM A SINGLE VENIPUNCTURE CANNOT BE PREDICTED WITHOUT FURTHER CLINICAL AND CULTURE CORRELATION. SUSCEPTIBILITIES AVAILABLE ONLY ON REQUEST.    Report Status 07/30/2015 FINAL  Final  Urine culture     Status: None   Collection Time: 07/27/15  4:50 PM  Result Value Ref Range Status   Specimen Description URINE, RANDOM  Final   Special Requests NONE  Final   Culture   Final    >=100,000 COLONIES/mL PROTEUS MIRABILIS 40,000 COLONIES/ml PSEUDOMONAS AERUGINOSA    Report Status 07/31/2015 FINAL  Final   Organism ID, Bacteria PROTEUS MIRABILIS  Final   Organism ID, Bacteria PSEUDOMONAS AERUGINOSA  Final      Susceptibility   Proteus mirabilis - MIC*    AMPICILLIN <=2 SENSITIVE Sensitive     CEFAZOLIN <=4 SENSITIVE Sensitive     CEFTRIAXONE <=1 SENSITIVE Sensitive     CIPROFLOXACIN <=0.25 SENSITIVE Sensitive     GENTAMICIN <=1 SENSITIVE Sensitive     IMIPENEM 8 INTERMEDIATE Intermediate     NITROFURANTOIN 128 RESISTANT Resistant     TRIMETH/SULFA <=20 SENSITIVE Sensitive     AMPICILLIN/SULBACTAM <=2 SENSITIVE Sensitive     PIP/TAZO <=4 SENSITIVE Sensitive     * >=100,000 COLONIES/mL PROTEUS MIRABILIS   Pseudomonas aeruginosa - MIC*    CEFTAZIDIME 2 SENSITIVE Sensitive     CIPROFLOXACIN >=4 RESISTANT Resistant     GENTAMICIN >=16 RESISTANT Resistant     IMIPENEM >=16 RESISTANT Resistant     CEFEPIME 8 SENSITIVE Sensitive     * 40,000 COLONIES/ml PSEUDOMONAS AERUGINOSA  Culture, respiratory (NON-Expectorated)     Status: None   Collection Time: 07/27/15  5:28 PM  Result Value Ref Range Status   Specimen Description TRACHEAL ASPIRATE  Final   Special Requests NONE  Final   Gram Stain   Final     ABUNDANT WBC PRESENT,BOTH PMN AND MONONUCLEAR NO SQUAMOUS EPITHELIAL CELLS SEEN NO ORGANISMS SEEN Performed at [facility]    Culture   Final    MODERATE METHICILLIN RESISTANT STAPHYLOCOCCUS AUREUS Note: RIFAMPIN  AND GENTAMICIN SHOULD NOT BE USED AS SINGLE DRUGS FOR TREATMENT OF STAPH INFECTIONS. This organism DOES NOT demonstrate inducible Clindamycin resistance in vitro. CRITICAL RESULT CALLED TO, READ BACK BY AND VERIFIED WITH: NICOLE WALLER AT  [id number] ON 07/30/15 BY WEBSP Performed at [facility]    Report Status 07/30/2015 FINAL  Final   Organism ID, Bacteria METHICILLIN RESISTANT STAPHYLOCOCCUS AUREUS  Final      Susceptibility   Methicillin resistant staphylococcus aureus - MIC*    CLINDAMYCIN <=0.25 SENSITIVE Sensitive     ERYTHROMYCIN >=8 RESISTANT Resistant     GENTAMICIN <=0.5 SENSITIVE Sensitive     LEVOFLOXACIN >=8 RESISTANT Resistant     OXACILLIN >=4 RESISTANT Resistant     RIFAMPIN <=0.5 SENSITIVE Sensitive     TRIMETH/SULFA >=320 RESISTANT Resistant     VANCOMYCIN 1 SENSITIVE Sensitive     TETRACYCLINE <=1 SENSITIVE Sensitive     * MODERATE METHICILLIN RESISTANT STAPHYLOCOCCUS AUREUS  MRSA PCR Screening     Status: Abnormal   Collection Time: 07/28/15  3:23 AM  Result Value Ref Range Status   MRSA by PCR POSITIVE (A) NEGATIVE Final    Comment:        The GeneXpert MRSA Assay (FDA approved for NASAL specimens only), is one component of a comprehensive MRSA colonization surveillance program. It is not intended to diagnose MRSA infection nor to guide or monitor treatment for MRSA infections. RESULT CALLED TO, READ BACK BY AND VERIFIED WITH: SPOKE TO RB PERRIN,J AT 6:32 ON 07/28/2015 [email or web address]   Body fluid culture     Status: None (Preliminary result)   Collection Time: 07/30/15  3:40 PM  Result Value Ref Range Status   Specimen Description FLUID RIGHT PLEURAL  Final   Special Requests NONE  Final   Gram Stain   Final    WBC PRESENT,  PREDOMINANTLY MONONUCLEAR NO ORGANISMS SEEN CYTOSPIN    Culture NO GROWTH 2 DAYS  Final   Report Status PENDING  Incomplete     Studies:  Recent x-ray studies have been reviewed in detail by the Attending Physician  Scheduled Meds:  Scheduled Meds: . antiseptic oral rinse  7 mL Mouth Rinse QID  . cefTRIAXone (ROCEPHIN)  IV  1 g Intravenous Q24H  . chlorhexidine gluconate  15 mL Mouth/Throat BID  . digoxin  0.125 mg Per Tube Daily  . escitalopram  20 mg Per Tube Daily  . feeding supplement (PRO-STAT SUGAR FREE 64)  60 mL Per Tube 5 X Daily  . feeding supplement (VITAL HIGH PROTEIN)  1,000 mL Per Tube Q24H  . fluconazole  200 mg Per Tube Daily  . folic acid  1 mg Per Tube Daily  . free water  200 mL Per Tube 4 times per day  . gabapentin  800 mg Per Tube BID  . insulin aspart  0-9 Units Subcutaneous 6 times per day  . ipratropium-albuterol  3 mL Nebulization QID  . metoprolol tartrate  12.5 mg Per Tube BID  . midodrine  5 mg Per Tube BID WC  . mirtazapine  15 mg Per Tube QHS  . mupirocin ointment  1 application Nasal BID  . nystatin   Topical TID  . pantoprazole sodium  40 mg Per Tube BID  . potassium chloride  10 mEq Intravenous Q1 Hr x 5  . vancomycin  1,000 mg Intravenous Q12H  . vitamin B-12  250 mcg Per Tube Daily    Time spent on care of this patient: 40 mins  Allie Bossier , MD  [facility] Office  [phone] Pager [phone]  On-Call/Text Page:      [email or web address]      password [email or web address]  If 7PM-7AM, please contact night-coverage [email or web address] Password [email or web address] 08/01/2015, 1:30 PM   LOS: 4 days   Care during the described time interval was provided by me .  I have reviewed this patient's available data, including medical history, events of note, physical examination, and all test results as part of my evaluation. I have personally reviewed and interpreted all radiology studies.   Dia Crawford, MD [phone] Pager

## 2015-08-01 NOTE — Progress Notes (Signed)
ANTICOAGULATION and ANTIBIOTIC CONSULT NOTE - Follow Up Consult  Pharmacy Consult for Heparin and vancomycin Indication: DVT and MRSA PNA  No Known Allergies  Patient Measurements: Height: 6\' 1"  (185.4 cm) Weight: 287 lb 11.2 oz (130.5 kg) IBW/kg (Calculated) : 79.9 Heparin Dosing Weight: 106  Vital Signs: Temp: 98.1 F (36.7 C) (11/17 0736) Temp Source: Axillary (11/17 0736) BP: 153/85 mmHg (11/17 0736) Pulse Rate: 63 (11/17 0908)  Labs:  Recent Labs  07/30/15 0535 07/31/15 0355 07/31/15 0702 08/01/15 0324  HGB 7.6* 8.1*  --  7.7*  HCT 26.4* 28.5*  --  26.8*  PLT 349 338  --  325  HEPARINUNFRC 0.29* 1.10* 0.41 0.46  CREATININE 0.43*  --   --  0.45*    Estimated Creatinine Clearance: 118.2 mL/min (by C-G formula based on Cr of 0.45).  Assessment: Coag: 74 yo M started on heparin for LLE DVT. AM HL therapeutic at 0.45 on 1850 units/hr.  Hgb low but stable at 7.7, pltc WNL. .  No active bleeding noted.  Will monitor closely since pt has hx of recent GIB and anemia.  ID: HCAP. Long hx of issues. Spinal cord injury, hx mrsa PNA, quadreplegia, multiple bouts of cdiff, sacral decub.  Afeb, WBC wnl; vanc for MRSA PNA, CTX for UTI - covers 100K proteus ( no indication to treat 40 K pseudomonas)  Vanc 11/12>>    ( has stop date 11/20 in EPIC) Zosyn 11/12>>11/16 Fluconazole (thrush) 11/13>> CTX 11/16>>  07/29/2015 VT 27  Change vancomycin 1 g IV q12h ( prev 1 gm x 1 then 1.5 q12)   11/12 BCx2 - 1/2 CoNS 11/12 UCx - >100K GNR - > 100 Kproteus mirabilis,  40 K pseudomonas aeruginosa 11/12 TA - moderate MRSA 11/13 MRSA PCR + 11/15 R pleural fluid>>ngtd    Goal of Therapy:  Heparin level 0.3-0.7 units/ml Monitor platelets by anticoagulation protocol: Yes  vanc trough 15-20 mcg/ml for PNA    Plan:  Continue heparin at 1850 units/hr Daily heparin level and CBC Follow up plans for long term anticoagulation Continue vancomycin 1 gm IV q12h until stop date of  11/20  Eudelia Bunch, Pharm.D. [id number] 08/01/2015 9:23 AM

## 2015-08-01 NOTE — Progress Notes (Addendum)
Speech Language Pathology Treatment: [name] Speaking valve  Patient Details Name: Thomas Zuniga MRN: [id number] DOB: [birth year] Today's Date: 08/01/2015 Time: 1440-1520 SLP Time Calculation (min) (ACUTE ONLY): 40 min  Assessment / Plan / Recommendation Clinical Impression  Treatment session focused on in-line Passy-Muir speaking valve in conjunction with respiratory therapy with pt on PRVC mode. RT deep suctioned prior and post deflating pt's cuff and turned PEEP to 3 (eventually to 0). Exhaled volume at 469 prior to cuff deflation. Pt consistently coughed and exhaled tidal volume jumped around, not stabilizing to 50% of original volumes. Cough continued and RT concerned pt not exhaling efficiently, switched mode to pressure support. Pt's coughing significantly decreased allowing pt to verbalize with adequate intensity and intelligibility with episodes of coughing dispersed throughout session. RT changed mode back to [facility] after several minutes on pressure support and this SLP under assumption pt was still on pressure support majority of the session therefore tidal volume had not been increased. Pt wore valve for approximately 30-35 minutes with stable SpO2, RR and HR stale. Continue ST intervention.     HPI HPI: 74 year old male with quadriplegia status post fall & C-spine surgery. Patient treated for recent MRSA pneumonia. He was having repeated desaturations while on ventilator sometimes with simple repositioning. Chest x-ray was concerning for persistent pneumonia with RLL consolidation.      SLP Plan  Continue with current plan of care     Recommendations  Medication Administration: Via alternative means      Patient may use Passy-Muir Speech Valve: with SLP only PMSV Supervision: Full       Oral Care Recommendations: Oral care QID Follow up Recommendations:  (tbd) Plan: Continue with current plan of care   Houston Siren 08/01/2015, 3:42 PM  Orbie Pyo Colvin Caroli.Ed  [employer] [phone]

## 2015-08-02 ENCOUNTER — Encounter (HOSPITAL_COMMUNITY): Payer: Self-pay | Admitting: Internal Medicine

## 2015-08-02 DIAGNOSIS — N319 Neuromuscular dysfunction of bladder, unspecified: Secondary | ICD-10-CM

## 2015-08-02 DIAGNOSIS — N189 Chronic kidney disease, unspecified: Secondary | ICD-10-CM

## 2015-08-02 DIAGNOSIS — L89159 Pressure ulcer of sacral region, unspecified stage: Secondary | ICD-10-CM

## 2015-08-02 DIAGNOSIS — N179 Acute kidney failure, unspecified: Secondary | ICD-10-CM

## 2015-08-02 DIAGNOSIS — Z9289 Personal history of other medical treatment: Secondary | ICD-10-CM

## 2015-08-02 DIAGNOSIS — R591 Generalized enlarged lymph nodes: Secondary | ICD-10-CM

## 2015-08-02 DIAGNOSIS — E662 Morbid (severe) obesity with alveolar hypoventilation: Secondary | ICD-10-CM

## 2015-08-02 LAB — BASIC METABOLIC PANEL
Anion gap: 5 (ref 5–15)
BUN: 19 mg/dL (ref 6–20)
CHLORIDE: 109 mmol/L (ref 101–111)
CO2: 28 mmol/L (ref 22–32)
CREATININE: 0.42 mg/dL — AB (ref 0.61–1.24)
Calcium: 8.1 mg/dL — ABNORMAL LOW (ref 8.9–10.3)
GFR calc non Af Amer: >60 mL/min (ref >60)
Glucose, Bld: 131 mg/dL — ABNORMAL HIGH (ref 65–99)
Potassium: 3.1 mmol/L — ABNORMAL LOW (ref 3.5–5.1)
Sodium: 142 mmol/L (ref 135–145)

## 2015-08-02 LAB — CBC
HCT: 25.3 % — ABNORMAL LOW (ref 39.0–52.0)
Hemoglobin: 7.4 g/dL — ABNORMAL LOW (ref 13.0–17.0)
MCH: 26.5 pg (ref 26.0–34.0)
MCHC: 29.2 g/dL — ABNORMAL LOW (ref 30.0–36.0)
MCV: 90.7 fL (ref 78.0–100.0)
PLATELETS: 327 10*3/uL (ref 150–400)
RBC: 2.79 MIL/uL — AB (ref 4.22–5.81)
RDW: 17.2 % — AB (ref 11.5–15.5)
WBC: 8.9 10*3/uL (ref 4.0–10.5)

## 2015-08-02 LAB — HEPARIN LEVEL (UNFRACTIONATED): HEPARIN UNFRACTIONATED: 0.39 [IU]/mL (ref 0.30–0.70)

## 2015-08-02 LAB — GLUCOSE, CAPILLARY
GLUCOSE-CAPILLARY: 117 mg/dL — AB (ref 65–99)
Glucose-Capillary: 102 mg/dL — ABNORMAL HIGH (ref 65–99)
Glucose-Capillary: 106 mg/dL — ABNORMAL HIGH (ref 65–99)
Glucose-Capillary: 108 mg/dL — ABNORMAL HIGH (ref 65–99)
Glucose-Capillary: 96 mg/dL (ref 65–99)

## 2015-08-02 LAB — VANCOMYCIN, TROUGH: VANCOMYCIN TR: 19 ug/mL (ref 10.0–20.0)

## 2015-08-02 LAB — METHYLMALONIC ACID, SERUM: METHYLMALONIC ACID, QUANTITATIVE: 224 nmol/L (ref 0–378)

## 2015-08-02 MED ORDER — FOSFOMYCIN TROMETHAMINE 3 G PO PACK
3.0000 g | PACK | Freq: Once | ORAL | Status: AC
  Administered 2015-08-02: 3 g via ORAL
  Filled 2015-08-02: qty 3

## 2015-08-02 MED ORDER — POTASSIUM CHLORIDE 10 MEQ/50ML IV SOLN
10.0000 meq | INTRAVENOUS | Status: DC
  Administered 2015-08-02 (×6): 10 meq via INTRAVENOUS
  Filled 2015-08-02 (×6): qty 50

## 2015-08-02 NOTE — Progress Notes (Signed)
Nutrition Follow-up  DOCUMENTATION CODES:   Obesity unspecified  INTERVENTION:    Continue Vital High Protein at 15 ml/hr   Continue Prostat liquid protein 60 ml 5 times daily  Above TF regimen provides 1360 kcals, 181 gm protein, 301 ml of free water  NUTRITION DIAGNOSIS:   Inadequate oral intake related to inability to eat as evidenced by NPO status, ongoing  GOAL:   Provide needs based on ASPEN/SCCM guidelines, met  MONITOR:   TF tolerance, Vent status, Labs, Weight trends, Skin, I & O's  ASSESSMENT:   74 year old male with quadriplegia status post fall & C-spine surgery. Patient treated for recent MRSA pneumonia. He was having repeated desaturations while on ventilator sometimes with simple repositioning. Chest x-ray was concerning for persistent pneumonia & patient was transferred to the emergency department for further evaluation.   Patient transferred from 2S-SICU to 2C-Stepdown 11/14.  Patient s/p procedure 11/15: THORACENTESIS  Patient is currently on ventilator support via trach MV: 9.5 L/min Temp (24hrs), Avg:98.1 F (36.7 C), Min:97.7 F (36.5 C), Max:98.8 F (37.1 C)   Current TF regimen:  Vital High Protein formula infusing at 15 ml/hr via PEG tube with Prostat liquid protein 60 ml 5 times daily which is providing 1360 kcals, 181 gm protein, 301 ml of free water.  Free water flushes at 200 ml every 6 hours.  Speech Path following for PMSV.  CCM note reviewed.  Plan is to start pressure support weaning.  Diet Order:  Diet NPO time specified  Skin:   (Stage I to buttocks)  Last BM:  11/18  Height:   Ht Readings from Last 1 Encounters:  07/29/15 6' 1"  (1.854 m)    Weight:   Wt Readings from Last 1 Encounters:  08/01/15 287 lb 11.2 oz (130.5 kg)    Ideal Body Weight:  83.63 kg  BMI:  Body mass index is 37.97 kg/(m^2).  Estimated Nutritional Needs:   Kcal:  1430-1820  Protein:  170-180 gm  Fluid:  per MD  EDUCATION NEEDS:   No  education needs identified at this time  Arthur Holms, RD, LDN Pager #: [phone] After-Hours Pager #: [phone]

## 2015-08-02 NOTE — Progress Notes (Signed)
Progress Note   Thomas Zuniga [id number] DOB: 07/16/1941 DOA: 07/27/2015 PCP: Verneita Griffes, MD   Brief Narrative:   Thomas Zuniga is an 74 y.o. 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 Chest x-ray was concerning for persistent pneumonia.  Procedure/Significant Events: 11/09 - Foley change 11/12 - CTA Chest - No PE.  -Moderate right pleural effusion with peripheral rind of enhancement./Right lower lobe consolidation.  -Enlarged right hilar & mediastinal lymph nodes.  11/13 - Admit to Hospital from [place] 11/13 - venous duplex - extensive L LE DVT 11/15 right pleural fluid NGTD  Assessment/Plan:   Sepsis/Acute on chronic hypoxic respiratory failure w/ chronic hypercarbic respiratory failure secondary to MRSA HCAP and complicated Proteus/Pseudomonas UTI - Serum lactate and Pro calcitonin elevated on admission. Source felt to be pneumonia versus UTI. - CT angiogram done 07/27/15: Findings concerning for aspiration pneumonia. Speech therapy following. - Continue vancomycin and Rocephin. Recently treated for MRSA pneumonia. - Give 1 dose of fosfomycin to cover Pseudomonas in the urine.  Culture data: 11/12 blood left hand NGTD 11/12 blood left hand positive coag negative staph 11/12 urine positive Proteus Mirabilis/Pseudomonas aeruginosa 11/12 trach aspirate positive MRSA  Active problems:  Morbid Obesity/dysphasia - Being followed by speech therapy and dietitian. Ferrel Logan of liquids with supervision only. Continue tube feeds per dietitian recommendations.  Lymphadenopathy - Status post  thoracentesis 07/30/15 for further evaluation. Nondiagnostic. - Dr. Ashok Cordia recommends waiting for resolution of MRSA pneumonia to determine if/when BAL required for definitive diagnosis.  Right Pleural Effusion - Trapped lung versus parapneumonic effusion. - Status post thoracentesis per IR; nondiagnostic. Exudative. Pleural fluid culture still pending. - PCCM to consider TCTS evaluation.   Trach dependent  - Previously doing 12hr T bar trials - vent and trach care per PCCM. - Patient had been weaning all day on CPAP/PS but was placed back on [facility] for the PMSV trial . - Status post Shiley #6 Cuffed Prox XLT Trach (from outside) placement. - Continuing to wean FiO2 for saturation greater than 94%. Continue nebs.  COPD - No evidence of acute exacerbation this time. Continue supplemental oxygen and nebs.  Chronic hypotension - Treated with Midodrine 5 mg BID.  1/2 blood cx positive coag negative staph  - Most likely contamination.  L LE DVT (groin to knee) - Noted on duplex - unclear chronicity - continue IV heparin.  H/O Atrial fibrillation - Normal sinus rhythm presently. Continue digoxin and metoprolol.  H/O Ventricular tachycardia - Monitoring on telemetry.  Dyslipidemia  Hypokalemia - Continue to replace.  Hypomagnesemia  - Corrected with supplementation.  Chronic foley due to Neurogenic bladder - Foley changed 07/24/15 per outside records - changed again 07/28/15 due to clogging.  Chronic Dysphagia - PEG dependent  - SLP evaluation performed 08/01/15- continue to utilize PEG for meds and feeds at this time. Nothing by mouth.  Anemia - normocytic - No evidence of significant acute blood loss - follow trend.  DM2 controlled - Currently being managed with insulin sensitive SSI every 4 hours. CBGs 96-127 -11/16 Hemoglobin A1c= 6.1.  Quadriplegia  - Turn and position every 2 hours.  Sacral decub - Seen by WOC who notes moisture associated skin damage but no  breakdown.  DVT  prophylaxis - Currently on IV heparin.  Family Communication/Anticipated D/C date and plan/Code Status   Family Communication: Wife at the bedside. Disposition Plan: From [facility].  Will likely need LTAC at D/C. Anticipated D/C date:   Depends on progress with regard to respiratory status stability, likely several more days. Code Status:     Code Status Orders        Start     Ordered   07/28/15 0125  Full code   Continuous     07/28/15 0126       IV Access:    PICC 08/01/15   Procedures and diagnostic studies:   Ct Angio Chest Pe W/cm &/or Wo Cm  07/27/2015  CLINICAL DATA:  Acute onset of severe hypoxia.  Initial encounter. EXAM: CT ANGIOGRAPHY CHEST WITH CONTRAST TECHNIQUE: Multidetector CT imaging of the chest was performed using the standard protocol during bolus administration of intravenous contrast. Multiplanar CT image reconstructions and MIPs were obtained to evaluate the vascular anatomy. CONTRAST:  129mL OMNIPAQUE IOHEXOL 350 MG/ML SOLN COMPARISON:  Chest radiograph performed earlier today at 4:35 p.m. FINDINGS: There is no evidence of pulmonary embolus. Evaluation for pulmonary embolus is suboptimal in areas of airspace consolidation. There is a complex small to moderate right-sided pleural effusion, with a peripheral rind of enhancement, raising concern for evolving empyema. Underlying dense partial consolidation of the right lower lobe is noted. There is partial opacification of the bronchus to the right lower lobe. This is suspicious for aspiration pneumonia. Mildly prominent right hilar nodes are seen, measuring up to 1.1 cm in short axis, and a 1.4 cm periaortic node is seen. A 1.9 cm right paratracheal node is noted. Underlying right infrahilar mass cannot be excluded. Minimal left-sided atelectasis is noted.  No pneumothorax is seen. A left-sided chest pacemaker, with a single lead ending at the right ventricle. Diffuse coronary artery  calcifications are seen. Biatrial enlargement is noted. There is mild prominence of the proximal aortic arch, measuring up to 3.9 cm. A tracheostomy tube is noted. No axillary lymphadenopathy is seen. The visualized portions of the thyroid gland are unremarkable in appearance. The visualized portions of the liver and spleen are unremarkable. The visualized portions of the pancreas, gallbladder, stomach and adrenal glands are within normal limits. Left-sided perinephric stranding is noted. No acute osseous abnormalities are seen. Cervical fusion hardware is partially imaged. Anterior bridging osteophytes are noted along the thoracic spine. Review of the MIP images confirms the above findings. IMPRESSION: 1. No evidence of pulmonary embolus. 2. Complex small to moderate right-sided pleural effusion, with a peripheral rind of enhancement, concerning for evolving empyema. Underlying dense partial consolidation of the right lower lung lobe, with partial opacification of the associated bronchus, suspicious for aspiration pneumonia. Diagnostic thoracentesis is suggested for further evaluation, given the complexity of the pleural effusion. This would also help to exclude underlying malignancy, given visualized lymphadenopathy. 3. Prominent right hilar and mediastinal nodes seen, measuring up to 1.9 cm in short axis. 4. Mild prominence of the proximal aortic arch, measuring up to 3.9 cm in diameter. 5. Diffuse coronary artery calcifications seen. Electronically Signed   By: Garald Balding M.D.   On: 07/27/2015 22:54   Dg Chest Port 1 View  07/27/2015  CLINICAL DATA:  Respiratory distress, recently diagnosed with pneumonia. Desaturations. EXAM: PORTABLE CHEST 1 VIEW COMPARISON:  None. FINDINGS: Heart is enlarged. Mediastinal diameter is also somewhat prominent but this is likely accentuated by the supine patient positioning. Left chest wall pacemaker/AICD in place.  There is prominence of the right perihilar shadow, of  uncertain etiology. Probable small right pleural effusion. Perhaps mild interstitial edema. Right-sided PICC line adequately positioned with tip in the upper SVC. Anterior cervical fusion hardware noted within the lower cervical spine. Old healed fracture of the left clavicle. No acute osseous abnormality seen. IMPRESSION: 1. Cardiomegaly. 2. Prominence of the right perihilar shadow may represent associated central pulmonary vascular congestion related to mild congestive heart failure/volume overload. Perihilar mass or pneumonia cannot be excluded on this single exam. 3. Slight prominence of the mediastinal diameter likely accentuated by the supine patient positioning. If any chest pain, would recommend chest CT angiogram for more definitive characterization. 4. Small right pleural effusion. 5. Probable mild bilateral interstitial edema. Electronically Signed   By: Franki Cabot M.D.   On: 07/27/2015 17:18     Medical Consultants:   Dr.Wesam Kathryne Sharper PCCM  Anti-Infectives:   Zosyn 11/12 >> 11/16 Vanc 11/12 >> Fluconazole 11/13>> Ceftriaxone 11/17>> Fosfomycin x 1 11/18  Subjective:   Iran Planas feels well. Denies feeling dyspneic at present. Wants something cold to drink.  Objective:    Filed Vitals:   08/02/15 1155 08/02/15 1509 08/02/15 1516 08/02/15 1700  BP: 102/59  111/63 108/44  Pulse: 79  78 79  Temp: 97.8 F (36.6 C)   97.6 F (36.4 C)  TempSrc: Axillary   Oral  Resp: 18  24 18   Height:      Weight:      SpO2: 100% 100% 99% 99%    Intake/Output Summary (Last 24 hours) at 08/02/15 1748 Last data filed at 08/02/15 1600  Gross per 24 hour  Intake 3094.5 ml  Output   1500 ml  Net 1594.5 ml   Filed Weights   07/30/15 0500 07/31/15 0400 08/01/15 0500  Weight: 129.1 kg (284 lb 9.8 oz) 128.9 kg (284 lb 2.8 oz) 130.5 kg (287 lb 11.2 oz)    Exam: Gen:  Morbidly obese Cardiovascular:  RRR, No M/R/G Respiratory:  Lungs CTAB Gastrointestinal:  Abdomen soft, NT/ND, +  BS Extremities:  1+ edema   Data Reviewed:    Labs: Basic Metabolic Panel:  Recent Labs Lab 07/28/15 0254 07/28/15 0334 07/28/15 2345 07/29/15 0157 07/30/15 0535 07/30/15 1245 08/01/15 0324 08/01/15 1320 08/02/15 1257  NA 139  --  143 143 143  --  141  --  142  K 4.2  --  2.6* 2.8* 2.8* 3.2* 2.8* 2.9* 3.1*  CL 97*  --  102 104 107  --  108  --  109  CO2 31  --  28 29 25   --  27  --  28  GLUCOSE 90  --  102* 131* 113*  --  115*  --  131*  BUN 25*  --  18 17 16   --  15  --  19  CREATININE 0.66  --  0.54* 0.55* 0.43*  --  0.45*  --  0.42*  CALCIUM 8.1*  --  8.4* 8.2* 8.2*  --  8.1*  --  8.1*  MG  --  2.6* 1.6*  --   --  1.6* 1.4* 2.0  --   PHOS 1.8*  --  4.9*  --   --   --   --   --   --    GFR Estimated Creatinine Clearance: 118.2 mL/min (by C-G formula based on Cr of 0.42). Liver Function Tests:  Recent Labs Lab 07/27/15 1630 07/28/15 0254 07/30/15 0535  AST 38  --  19  ALT 22  --  14*  ALKPHOS 64  --  46  BILITOT 0.6  --  0.6  PROT 7.2  --  5.9*  ALBUMIN 2.1* 1.9* 1.8*   CBC:  Recent Labs Lab 07/27/15 1630 07/28/15 0334  07/29/15 1200 07/30/15 0535 07/31/15 0355 08/01/15 0324 08/02/15 0515  WBC 12.7* 7.8  < > 10.7* 10.6* 9.5 10.0 8.9  NEUTROABS 11.4* 5.7  --   --   --   --   --   --   HGB 8.9* 9.9*  < > 7.4* 7.6* 8.1* 7.7* 7.4*  HCT 29.1* 32.5*  < > 25.4* 26.4* 28.5* 26.8* 25.3*  MCV 89.0 100.0  < > 91.7 90.7 91.1 90.8 90.7  PLT 280 200  < > 281 349 338 325 327  < > = values in this interval not displayed. CBG:  Recent Labs Lab 08/01/15 1727 08/01/15 2035 08/02/15 0027 08/02/15 0430 08/02/15 1657  GLUCAP 104* 127* 106* 96 108*   Hgb A1c:  Recent Labs  07/31/15 0355  HGBA1C 6.1*   Lipid Profile:  Recent Labs  07/31/15 0355  CHOL 119  HDL 20*  LDLCALC 73  TRIG 128  CHOLHDL 6.0   Sepsis Labs:  Recent Labs Lab 07/27/15 1701 07/27/15 2058 07/27/15 2246 07/28/15 0334 07/28/15 2345  07/30/15 0535 07/31/15 0355  08/01/15 0324 08/02/15 0515  PROCALCITON  --   --  2.40  --  2.47  --  2.40  --   --   --   WBC  --   --   --  7.8 13.1*  < > 10.6* 9.5 10.0 8.9  LATICACIDVEN 3.12* 3.32*  --  0.6  --   --   --   --   --   --   < > = values in this interval not displayed. Microbiology Recent Results (from the past 240 hour(s))  Blood Culture (routine x 2)     Status: None   Collection Time: 07/27/15  4:30 PM  Result Value Ref Range Status   Specimen Description BLOOD LEFT HAND  Final   Special Requests BOTTLES DRAWN AEROBIC AND ANAEROBIC 5CC  Final   Culture NO GROWTH 5 DAYS  Final   Report Status 08/01/2015 FINAL  Final  Blood Culture (routine x 2)     Status: None   Collection Time: 07/27/15  4:50 PM  Result Value Ref Range Status   Specimen Description BLOOD LEFT HAND  Final   Special Requests IN PEDIATRIC BOTTLE 2CC  Final   Culture  Setup Time   Final    GRAM POSITIVE COCCI IN CLUSTERS AEROBIC BOTTLE ONLY CRITICAL RESULT CALLED TO, READ BACK BY AND VERIFIED WITH: P KEATON,RN AT [id number] 07/28/15 BY L BENFIELD    Culture   Final    STAPHYLOCOCCUS SPECIES (COAGULASE NEGATIVE) THE SIGNIFICANCE OF ISOLATING THIS ORGANISM FROM A SINGLE VENIPUNCTURE CANNOT BE PREDICTED WITHOUT FURTHER CLINICAL AND CULTURE CORRELATION. SUSCEPTIBILITIES AVAILABLE ONLY ON REQUEST.    Report Status 07/30/2015 FINAL  Final  Urine culture     Status: None   Collection Time: 07/27/15  4:50 PM  Result Value Ref Range Status   Specimen Description URINE, RANDOM  Final   Special Requests NONE  Final   Culture   Final    >=100,000 COLONIES/mL PROTEUS MIRABILIS 40,000 COLONIES/ml PSEUDOMONAS AERUGINOSA    Report Status 07/31/2015 FINAL  Final   Organism ID, Bacteria PROTEUS MIRABILIS  Final   Organism ID, Bacteria PSEUDOMONAS AERUGINOSA  Final      Susceptibility   Proteus mirabilis - MIC*    AMPICILLIN <=2 SENSITIVE Sensitive     CEFAZOLIN <=4 SENSITIVE Sensitive     CEFTRIAXONE <=1 SENSITIVE Sensitive      CIPROFLOXACIN <=0.25 SENSITIVE Sensitive     GENTAMICIN <=1 SENSITIVE Sensitive     IMIPENEM 8 INTERMEDIATE Intermediate     NITROFURANTOIN 128 RESISTANT Resistant     TRIMETH/SULFA <=20 SENSITIVE Sensitive     AMPICILLIN/SULBACTAM <=2 SENSITIVE Sensitive     PIP/TAZO <=4 SENSITIVE Sensitive     * >=100,000 COLONIES/mL PROTEUS MIRABILIS   Pseudomonas aeruginosa - MIC*    CEFTAZIDIME 2 SENSITIVE Sensitive     CIPROFLOXACIN >=4 RESISTANT Resistant     GENTAMICIN >=16 RESISTANT Resistant     IMIPENEM >=16 RESISTANT Resistant     CEFEPIME 8 SENSITIVE Sensitive     * 40,000 COLONIES/ml PSEUDOMONAS AERUGINOSA  Culture, respiratory (NON-Expectorated)     Status: None   Collection Time: 07/27/15  5:28 PM  Result Value Ref Range Status   Specimen Description TRACHEAL ASPIRATE  Final   Special Requests NONE  Final   Gram Stain   Final    ABUNDANT WBC PRESENT,BOTH PMN AND MONONUCLEAR NO SQUAMOUS EPITHELIAL CELLS SEEN NO ORGANISMS SEEN Performed at [facility]    Culture   Final    MODERATE METHICILLIN RESISTANT STAPHYLOCOCCUS AUREUS Note: RIFAMPIN AND GENTAMICIN SHOULD NOT BE USED AS SINGLE DRUGS FOR TREATMENT OF STAPH INFECTIONS. This organism DOES NOT demonstrate inducible Clindamycin resistance in vitro. CRITICAL RESULT CALLED TO, READ BACK BY AND VERIFIED WITH: NICOLE WALLER AT  [id number] ON 07/30/15 BY WEBSP Performed at [facility]    Report Status 07/30/2015 FINAL  Final   Organism ID, Bacteria METHICILLIN RESISTANT STAPHYLOCOCCUS AUREUS  Final      Susceptibility   Methicillin resistant staphylococcus aureus - MIC*    CLINDAMYCIN <=0.25 SENSITIVE Sensitive     ERYTHROMYCIN >=8 RESISTANT Resistant     GENTAMICIN <=0.5 SENSITIVE Sensitive     LEVOFLOXACIN >=8 RESISTANT Resistant     OXACILLIN >=4 RESISTANT Resistant     RIFAMPIN <=0.5 SENSITIVE Sensitive     TRIMETH/SULFA >=320 RESISTANT Resistant     VANCOMYCIN 1 SENSITIVE Sensitive     TETRACYCLINE <=1 SENSITIVE  Sensitive     * MODERATE METHICILLIN RESISTANT STAPHYLOCOCCUS AUREUS  MRSA PCR Screening     Status: Abnormal   Collection Time: 07/28/15  3:23 AM  Result Value Ref Range Status   MRSA by PCR POSITIVE (A) NEGATIVE Final    Comment:        The GeneXpert MRSA Assay (FDA approved for NASAL specimens only), is one component of a comprehensive MRSA colonization surveillance program. It is not intended to diagnose MRSA infection nor to guide or monitor treatment for MRSA infections. RESULT CALLED TO, READ BACK BY AND VERIFIED WITH: SPOKE TO RB PERRIN,J AT 6:32 ON 07/28/2015 [email or web address]   Body fluid culture     Status: None (Preliminary result)   Collection Time: 07/30/15  3:40 PM  Result Value Ref Range Status   Specimen Description FLUID RIGHT PLEURAL  Final   Special Requests NONE  Final   Gram Stain   Final    WBC PRESENT, PREDOMINANTLY MONONUCLEAR NO ORGANISMS SEEN CYTOSPIN    Culture NO GROWTH 3 DAYS  Final   Report Status PENDING  Incomplete     Medications:   . antiseptic oral rinse  7 mL Mouth Rinse QID  .  chlorhexidine gluconate  15 mL Mouth/Throat BID  . digoxin  0.125 mg Per Tube Daily  . escitalopram  20 mg Per Tube Daily  . feeding supplement (PRO-STAT SUGAR FREE 64)  60 mL Per Tube 5 X Daily  . feeding supplement (VITAL HIGH PROTEIN)  1,000 mL Per Tube Q24H  . fluconazole  200 mg Per Tube Daily  . folic acid  1 mg Per Tube Daily  . fosfomycin  3 g Oral Once  . free water  200 mL Per Tube 4 times per day  . gabapentin  800 mg Per Tube BID  . insulin aspart  0-9 Units Subcutaneous 6 times per day  . ipratropium-albuterol  3 mL Nebulization QID  . metoprolol tartrate  12.5 mg Per Tube BID  . midodrine  5 mg Per Tube BID WC  . mirtazapine  15 mg Per Tube QHS  . nystatin   Topical TID  . pantoprazole sodium  40 mg Per Tube BID  . vancomycin  1,000 mg Intravenous Q12H  . vitamin B-12  250 mcg Per Tube Daily   Continuous Infusions: . sodium chloride 50 mL/hr  (08/02/15 1050)  . heparin 1,850 Units/hr (08/02/15 1600)    Time spent: 35 minutes.  The patient is medically complex with multiple co-morbidities and is at high risk for clinical deterioration and requires high complexity decision making.    LOS: 5 days   [facility] Pager [phone]. If unable to reach me by pager, please call my cell phone at [phone].  *Please refer to [email or web address], password [email or web address] to get updated schedule on who will round on this patient, as hospitalists switch teams weekly. If 7PM-7AM, please contact night-coverage at [email or web address], password [email or web address] for any overnight needs.  08/02/2015, 5:48 PM

## 2015-08-02 NOTE — Progress Notes (Addendum)
Speech Language Pathology Treatment: [name] Speaking valve;Dysphagia  Patient Details Name: Thomas Zuniga MRN: [id number] DOB: 02-May-1941 Today's Date: 08/02/2015 Time: [id number] SLP Time Calculation (min) (ACUTE ONLY): 23 min  Assessment / Plan / Recommendation Clinical Impression  In-line speaking valve treatment session with RT. RT changed mode from CPAP to PRSV due pressure of 20 on CPAP. Valve placed following adjustments by RT including cuff deflation (decreased VTe indicating patent upper airway), PEEP decreased to 0, tidal volume increased to 580 and PIP back to baseline. Oral suctioning required initially. Pt conversed with therapists and wife with 100% intelligibility adequate intensity and adequate respiratory effort. RR, HR and SpO2 stable. Continue in-line trials with RT and ST.  Head of bed elevated, pt consumed trials of single ice chips without overt change in vocal quality, respirations or vital signs. Recommend pt have small ice chips following oral care TID and repeat FEES beginning next week (Mon or Tues) for initiation of food/beverage.    HPI HPI: 74 year old male with quadriplegia status post fall & C-spine surgery. Patient treated for recent MRSA pneumonia. He was having repeated desaturations while on ventilator sometimes with simple repositioning. Chest x-ray was concerning for persistent pneumonia with RLL consolidation.      SLP Plan  Continue with current plan of care     Recommendations  Diet recommendations:  (ice chips tid) Liquids provided via: Teaspoon Supervision: Full supervision/cueing for compensatory strategies Postural Changes and/or Swallow Maneuvers: Seated upright 90 degrees      Patient may use Passy-Muir Speech Valve: with SLP only PMSV Supervision: Full       Oral Care Recommendations: Oral care QID;Oral care prior to ice chip/H20 Follow up Recommendations:  (tbd) Plan: Continue with current plan of care   Houston Siren 08/02/2015, 4:47 PM

## 2015-08-02 NOTE — Progress Notes (Signed)
Vanc trough drawn 18:00 ( 30 mts prior to administration of Vanco..

## 2015-08-02 NOTE — Progress Notes (Signed)
RT Note: Patient had a PMSV trial today and did very well. Speech therapy remained at bedside throughout the trial. Patient had been weaning all day on cpap/ps but was placed back on [facility] for the trial. Tidal Volume was increased from his rest mode setting of 480 to 580 to maintain initial PIP and peep was dropped from his initial rest mode setting of 5 to 0 for the trial. Also the patients cuff was deflated on his trach. Patient tolerated those vent changes well and was able to speak clearly and comfortable with the PMSV on. He had no complications. Post trial, patients cuff was re-inflated and patient was placed back on his weaning settings of cpap/ps. Tonight he will go back on his rest mode and will be on his initial rest mode vent settings. Patients wife was at bedside as well so patient was able to speak with his wife. Rt will continue to monitor and assist with PMSV trials.

## 2015-08-02 NOTE — Progress Notes (Signed)
Abx consult:  Pt is currently on treatment for MRSA PNA and UTI (pseudomonas and proteus). She was changed from zosyn to rocephin yesterday but this will not cover the pseudomonas. D/w with MD today and we are going to give 1 dose of fosfomycin to complete the UTI course.   Onnie Boer, PharmD Pager: [phone] 08/02/2015 1:39 PM

## 2015-08-02 NOTE — Progress Notes (Signed)
ANTICOAGULATION CONSULT NOTE - Follow Up Consult  Pharmacy Consult for Heparin Indication: DVT  No Known Allergies  Patient Measurements: Height: 6\' 1"  (185.4 cm) Weight: 287 lb 11.2 oz (130.5 kg) IBW/kg (Calculated) : 79.9 Heparin Dosing Weight: 106  Vital Signs: Temp: 98.3 F (36.8 C) (11/18 0800) Temp Source: Axillary (11/18 0800) BP: 119/62 mmHg (11/18 0932) Pulse Rate: 92 (11/18 0932)  Labs:  Recent Labs  07/31/15 0355 07/31/15 0702 08/01/15 0324 08/02/15 0515  HGB 8.1*  --  7.7* 7.4*  HCT 28.5*  --  26.8* 25.3*  PLT 338  --  325 327  HEPARINUNFRC 1.10* 0.41 0.46 0.39  CREATININE  --   --  0.45*  --     Estimated Creatinine Clearance: 118.2 mL/min (by C-G formula based on Cr of 0.45).  Assessment: 74 yo M continues on heparin for LLE DVT. HEparin level remains therapeutic at 0.39. H/H + platelets are stable and no bleeding noted. Will monitor closely since pt has hx of recent GIB and anemia.  Goal of Therapy:  Heparin level 0.3-0.7 units/ml Monitor platelets by anticoagulation protocol: Yes   Plan:  Continue heparin at 1850 units/hr Daily heparin level and CBC Follow up plans for long term anticoagulation  Salome Arnt, PharmD, BCPS Pager # [phone] 08/02/2015 11:43 AM

## 2015-08-02 NOTE — Progress Notes (Signed)
PULMONARY / CRITICAL CARE MEDICINE   Name: Thomas Zuniga MRN: [id number] DOB: 1941/01/14    ADMISSION DATE:  07/27/2015 CONSULTATION DATE:  07/28/2015  REFERRING MD :  EDP  CHIEF COMPLAINT:  HCAP  INITIAL PRESENTATION:  74 year old male with quadriplegia status post fall & C-spine surgery. Patient treated for recent MRSA pneumonia. He was having repeated desaturations while on ventilator sometimes with simple repositioning. Chest x-ray was concerning for persistent pneumonia & patient was transferred to the emergency department for further evaluation.  STUDIES:  CTA Chest 07/27/15 (personally reviewed by me) - No PE. Moderate right pleural effusion with peripheral rind of enhancement. Right lower lobe consolidation. Enlarged right hilar & mediastinal lymph nodes. No pericardial effusion. VENOUS DUPLEX 07/28/15 - Extensive LLE DVT TTE 07/30/15 - LV normal in size with EF 55-60%. No wall motion abnormalities. Unable to assess diastolic function. LA severely dilated. RA normal in size. RV mildly dilated with normal systolic function. No aortic stenosis or regurgitation. Trivial mitral regurgitation without stenosis. No pulmonic stenosis. Mild tricuspid regurgitation. No pericardial effusion.   SIGNIFICANT EVENTS: 11/09 - Foley change 11/13 - Admit to Hospital from [facility] 11/17 - LUE PICC placed in IR  OUTSIDE LABS: 07/24/15 ABG (on PCV 28/5 0.4 22):  7.42/72/97  06/27/15 CBC: 6.7/8.1/24.8/223 BMP: 138/4.0/96/33/13/0.5/100/8.3 Magnesium: 1.8 LFT: 2.6/6.1/0.7/75/18/29  CULTURES: Right Pleural Fluid 11/15>>> BCx2 11/12>>>Coag Neg Staph 1/2 UC 11/12 - Proteus mirabilis & Pseudomonas aeruginosa Tracheal Asp 11/12 - MRSA  ANTIBIOTICS: Ceftriaxone 11/16>>> Vancomycin 11/12>>> Diflucan via tube 11/2>>> Nystatin Powder 11/2>>>  Zosyn 11/12 - 11/16  Right PFA (07/30/15): Glucose - 36 LDH - 2372 Total Protein - <3.0 WBC - 7  HISTORY OF PRESENT ILLNESS:  History was obtained  from the patient's wife and my review of his medical records from [place]. He is an unfortunate 74 year old male who suffered a fall and underwent subsequent C-spine surgeries ultimately leading to quadriplegia. The patient previously underwent tracheostomy & PEG tube placement. Per the patient's records fairly recently he was treated for an MRSA pneumonia with vancomycin. Exactly when this was is unclear from the records. I can discern that his ventilator settings were increased recently/today and according to his wife this was secondary to intermittent hypoxia. She is unsure whether or not he actually had any fever while at the facility. Most notably the patient seem to desaturate with repositioning at times. On arrival in the emergency department the patient was noted to have an elevated lactic acid as well as right lower lobe consolidation with associated pleural effusion that appears to have a rind. Previously the patient was tolerating T bar trials for 12 hours at a time. He has continued to have difficulty with swallowing and was undergoing speech therapy. The patient's records also note of a recent GI bleed.  SUBJECTIVE: Patient underwent left upper extremity PICC line placement yesterday and interventional radiology. Did have transient low blood pressure overnight which has since resolved. No other acute events.  REVIEW OF SYSTEMS: Unable to obtain accurate review of systems the patient remains on ventilator.  VITAL SIGNS: Temp:  [97.7 F (36.5 C)-98.8 F (37.1 C)] 98.8 F (37.1 C) (11/18 0400) Pulse Rate:  [63-88] 71 (11/18 0805) Resp:  [17-23] 21 (11/18 0805) BP: (72-147)/(41-132) 116/54 mmHg (11/18 0805) SpO2:  [98 %-100 %] 100 % (11/18 0811) FiO2 (%):  [30 %] 30 % (11/18 0811) HEMODYNAMICS:   VENTILATOR SETTINGS: Vent Mode:  [-] CPAP;PSV FiO2 (%):  [30 %] 30 % Set Rate:  [17 bmp] 17 bmp  Vt Set:  [480 mL] 480 mL PEEP:  [5 cmH20] 5 cmH20 Pressure Support:  [20 cmH20] 20  cmH20 Plateau Pressure:  [20 cmH20-23 cmH20] 23 cmH20 INTAKE / OUTPUT:  Intake/Output Summary (Last 24 hours) at 08/02/15 0901 Last data filed at 08/02/15 [id number]  Gross per 24 hour  Intake 2019.5 ml  Output   1950 ml  Net   69.5 ml   PHYSICAL EXAMINATION: General:  Eyes closed. Wife at bedside. No distress.  Integument: Warm and dry. No rash on exposed skin. Left upper extremity PICC line in place. Abdominal PEG tube in place. HEENT:  Tacky mucus membranes. Tracheostomy in place. No scleral icterus. Cardiovascular:  Regular rate. Bilateral lower extremity edema persists. No appreciable JVD given body habitus.  Pulmonary:  Coarse breath sounds bilaterally on ventilator with slight improvement. Symmetric chest wall rise on ventilator. Secretions now mostly clear to tan and only moderate. Abdomen: Soft. Normal bowel sounds. Nondistended.  LABS:  CBC  Recent Labs Lab 07/31/15 0355 08/01/15 0324 08/02/15 0515  WBC 9.5 10.0 8.9  HGB 8.1* 7.7* 7.4*  HCT 28.5* 26.8* 25.3*  PLT 338 325 327   Coag's No results for input(s): APTT, INR in the last 168 hours. BMET  Recent Labs Lab 07/29/15 0157 07/30/15 0535 07/30/15 1245 08/01/15 0324 08/01/15 1320  NA 143 143  --  141  --   K 2.8* 2.8* 3.2* 2.8* 2.9*  CL 104 107  --  108  --   CO2 29 25  --  27  --   BUN 17 16  --  15  --   CREATININE 0.55* 0.43*  --  0.45*  --   GLUCOSE 131* 113*  --  115*  --    Electrolytes  Recent Labs Lab 07/28/15 0254  07/28/15 2345 07/29/15 0157 07/30/15 0535 07/30/15 1245 08/01/15 0324 08/01/15 1320  CALCIUM 8.1*  --  8.4* 8.2* 8.2*  --  8.1*  --   MG  --   < > 1.6*  --   --  1.6* 1.4* 2.0  PHOS 1.8*  --  4.9*  --   --   --   --   --   < > = values in this interval not displayed. Sepsis Markers  Recent Labs Lab 07/27/15 1701 07/27/15 2058 07/27/15 2246 07/28/15 0334 07/28/15 2345 07/30/15 0535  LATICACIDVEN 3.12* 3.32*  --  0.6  --   --   PROCALCITON  --   --  2.40  --  2.47  2.40   ABG  Recent Labs Lab 07/27/15 1706 07/28/15 0440 07/28/15 0630  PHART 7.556* 7.626* 7.490*  PCO2ART 42.3 29.3* 41.3  PO2ART 135.0* 115* 81.9   Liver Enzymes  Recent Labs Lab 07/27/15 1630 07/28/15 0254 07/30/15 0535  AST 38  --  19  ALT 22  --  14*  ALKPHOS 64  --  46  BILITOT 0.6  --  0.6  ALBUMIN 2.1* 1.9* 1.8*   Cardiac Enzymes No results for input(s): TROPONINI, PROBNP in the last 168 hours. Glucose  Recent Labs Lab 08/01/15 0739 08/01/15 1147 08/01/15 1727 08/01/15 2035 08/02/15 0027 08/02/15 0430  GLUCAP 119* 123* 104* 127* 106* 96    Imaging Ir Fluoro Guide Cv Line Left  08/01/2015  CLINICAL DATA:  Heart failure, tracheostomy, ventilatory support, poor peripheral access EXAM: POWER PICC LINE PLACEMENT WITH ULTRASOUND AND FLUOROSCOPIC GUIDANCE FLUOROSCOPY TIME:  24 seconds PROCEDURE: The patient was advised of the possible risks andcomplications and agreed to  undergo the procedure. The patient was then brought to the angiographic suite for the procedure. The right arm was prepped with chlorhexidine, drapedin the usual sterile fashion using maximum barrier technique (cap and mask, sterile gown, sterile gloves, large sterile sheet, hand hygiene and cutaneous antisepsis) and infiltrated locally with 1% Lidocaine. Ultrasound demonstrated patency of the right basilic vein, and this was documented with an image. Under real-time ultrasound guidance, this vein was accessed with a 21 gauge micropuncture needle and image documentation was performed. A 0.018 wire was introduced in to the vein. Over this, a 5 [place] double lumen power PICC was advanced to the lower SVC/right atrial junction. Fluoroscopy during the procedure and fluoro spot radiograph confirms appropriate catheter position. The catheter was flushed and covered with asterile dressing. Catheter length: 54 Complications: None immediate IMPRESSION: Successful right arm power PICC line placement with ultrasound  and fluoroscopic guidance. The catheter is ready for use. Electronically Signed   By: Jerilynn Mages.  Shick M.D.   On: 08/01/2015 16:59   Ir US Guide Vasc Access Left  08/01/2015  CLINICAL DATA:  Heart failure, tracheostomy, ventilatory support, poor peripheral access EXAM: POWER PICC LINE PLACEMENT WITH ULTRASOUND AND FLUOROSCOPIC GUIDANCE FLUOROSCOPY TIME:  24 seconds PROCEDURE: The patient was advised of the possible risks andcomplications and agreed to undergo the procedure. The patient was then brought to the angiographic suite for the procedure. The right arm was prepped with chlorhexidine, drapedin the usual sterile fashion using maximum barrier technique (cap and mask, sterile gown, sterile gloves, large sterile sheet, hand hygiene and cutaneous antisepsis) and infiltrated locally with 1% Lidocaine. Ultrasound demonstrated patency of the right basilic vein, and this was documented with an image. Under real-time ultrasound guidance, this vein was accessed with a 21 gauge micropuncture needle and image documentation was performed. A 0.018 wire was introduced in to the vein. Over this, a 5 [place] double lumen power PICC was advanced to the lower SVC/right atrial junction. Fluoroscopy during the procedure and fluoro spot radiograph confirms appropriate catheter position. The catheter was flushed and covered with asterile dressing. Catheter length: 54 Complications: None immediate IMPRESSION: Successful right arm power PICC line placement with ultrasound and fluoroscopic guidance. The catheter is ready for use. Electronically Signed   By: Jerilynn Mages.  Shick M.D.   On: 08/01/2015 16:59   ASSESSMENT / PLAN:  74 year old male with acute on chronic hypoxic respiratory failure status post prior tracheostomy & PEG tube placement. Patient has a right pleural effusion that underwent thoracentesis by interventional radiology showing exudate by LDH only.  Cultures are pending. Left upper extremity PICC line placed yesterday. Exudative  right pleural effusion is likely parapneumonic given the elevation of LDH. Plan to start pressure support weaning today.  1. Acute on chronic hypoxic respiratory failure: Status post Shiley #6 Cuffed Prox XLT Trach (from outside) placement. Continuing to wean FiO2 for saturation greater than 94%. Continuing treatment of underlying MRSA pneumonia. Continuing to nebs every 6 hours for airway clearance. Initiating pressure support wean with full ventilatory support overnight on PRBC. 2. MRSA pneumonia: Continuing on treatment with Vancomycin Day #7/14. 3. Chronic hypercarbic respiratory failure: Continuing Duonebs. No evidence for acute decompensation. 4. Exudative right pleural effusion:  Awaiting culture result which is pending. 5. Left lower extremity DVT: Continuing on heparin drip for systemic anticoagulation per pharmacy protocol. 6. Anemia: No signs of active bleeding. Hemoglobin stable. 7. Proteus & Pseudomonas UTI: Currently on Rocephin & vancomycin. Managed per primary service.  We will not round on the patient  over the weekend. Please contact the on-call physician if there is an acute change or question.  Sonia Baller Ashok Cordia, M.D. [facility] Pulmonary & Critical Care Pager:  [phone] After 3pm or if no response, call [phone] 08/02/2015, 9:01 AM

## 2015-08-03 LAB — CBC
HCT: 26 % — ABNORMAL LOW (ref 39.0–52.0)
Hemoglobin: 7.4 g/dL — ABNORMAL LOW (ref 13.0–17.0)
MCH: 26 pg (ref 26.0–34.0)
MCHC: 28.5 g/dL — ABNORMAL LOW (ref 30.0–36.0)
MCV: 91.2 fL (ref 78.0–100.0)
PLATELETS: 328 10*3/uL (ref 150–400)
RBC: 2.85 MIL/uL — AB (ref 4.22–5.81)
RDW: 17.3 % — ABNORMAL HIGH (ref 11.5–15.5)
WBC: 8 10*3/uL (ref 4.0–10.5)

## 2015-08-03 LAB — BASIC METABOLIC PANEL
Anion gap: 7 (ref 5–15)
BUN: 19 mg/dL (ref 6–20)
CO2: 27 mmol/L (ref 22–32)
Calcium: 8.2 mg/dL — ABNORMAL LOW (ref 8.9–10.3)
Chloride: 108 mmol/L (ref 101–111)
Creatinine, Ser: 0.4 mg/dL — ABNORMAL LOW (ref 0.61–1.24)
GFR calc Af Amer: >60 mL/min (ref >60)
Glucose, Bld: 112 mg/dL — ABNORMAL HIGH (ref 65–99)
POTASSIUM: 3 mmol/L — AB (ref 3.5–5.1)
SODIUM: 142 mmol/L (ref 135–145)

## 2015-08-03 LAB — BODY FLUID CULTURE: Culture: NO GROWTH

## 2015-08-03 LAB — GLUCOSE, CAPILLARY
GLUCOSE-CAPILLARY: 110 mg/dL — AB (ref 65–99)
Glucose-Capillary: 101 mg/dL — ABNORMAL HIGH (ref 65–99)
Glucose-Capillary: 109 mg/dL — ABNORMAL HIGH (ref 65–99)
Glucose-Capillary: 110 mg/dL — ABNORMAL HIGH (ref 65–99)
Glucose-Capillary: 116 mg/dL — ABNORMAL HIGH (ref 65–99)
Glucose-Capillary: 99 mg/dL (ref 65–99)

## 2015-08-03 LAB — C DIFFICILE QUICK SCREEN W PCR REFLEX
C Diff antigen: NEGATIVE
C Diff interpretation: NEGATIVE
C Diff toxin: NEGATIVE

## 2015-08-03 LAB — VANCOMYCIN, TROUGH: VANCOMYCIN TR: 23 ug/mL — AB (ref 10.0–20.0)

## 2015-08-03 LAB — PROTIME-INR
INR: 1.28 (ref 0.00–1.49)
PROTHROMBIN TIME: 16.2 s — AB (ref 11.6–15.2)

## 2015-08-03 LAB — HEPARIN LEVEL (UNFRACTIONATED): HEPARIN UNFRACTIONATED: 0.4 [IU]/mL (ref 0.30–0.70)

## 2015-08-03 MED ORDER — VANCOMYCIN HCL 10 G IV SOLR
1250.0000 mg | INTRAVENOUS | Status: DC
  Administered 2015-08-04 – 2015-08-06 (×3): 1250 mg via INTRAVENOUS
  Filled 2015-08-03 (×4): qty 1250

## 2015-08-03 MED ORDER — COUMADIN BOOK
Freq: Once | Status: AC
  Administered 2015-08-03: 18:00:00
  Filled 2015-08-03: qty 1

## 2015-08-03 MED ORDER — WARFARIN SODIUM 5 MG PO TABS
5.0000 mg | ORAL_TABLET | Freq: Once | ORAL | Status: AC
  Administered 2015-08-03: 5 mg via ORAL
  Filled 2015-08-03: qty 1

## 2015-08-03 MED ORDER — WARFARIN - PHARMACIST DOSING INPATIENT
Freq: Every day | Status: DC
  Administered 2015-08-04 – 2015-08-05 (×2)

## 2015-08-03 MED ORDER — WARFARIN VIDEO: Freq: Once | Status: DC

## 2015-08-03 MED ORDER — VANCOMYCIN HCL 10 G IV SOLR: 1250.0000 mg | INTRAVENOUS | Status: DC

## 2015-08-03 MED ORDER — POTASSIUM CHLORIDE 20 MEQ/15ML (10%) PO SOLN
40.0000 meq | Freq: Two times a day (BID) | ORAL | Status: DC
  Administered 2015-08-03 – 2015-08-06 (×7): 40 meq
  Filled 2015-08-03 (×7): qty 30

## 2015-08-03 NOTE — Progress Notes (Addendum)
ANTICOAGULATION CONSULT NOTE - Follow Up Consult  Pharmacy Consult for Heparin Indication: DVT  No Known Allergies  Patient Measurements: Height: 6\' 1"  (185.4 cm) Weight: (!) 360 lb (163.295 kg) IBW/kg (Calculated) : 79.9 Heparin Dosing Weight: 106  Vital Signs: Temp: 98.1 F (36.7 C) (11/19 0422) Temp Source: Axillary (11/19 0422) BP: 115/65 mmHg (11/19 0422) Pulse Rate: 79 (11/19 0422)  Labs:  Recent Labs  08/01/15 0324 08/02/15 0515 08/02/15 1257 08/03/15 0530  HGB 7.7* 7.4*  --  7.4*  HCT 26.8* 25.3*  --  26.0*  PLT 325 327  --  328  HEPARINUNFRC 0.46 0.39  --  0.40  CREATININE 0.45*  --  0.42* 0.40*    Estimated Creatinine Clearance: 133.8 mL/min (by C-G formula based on Cr of 0.4).  Assessment: 74 yo M continues on heparin for LLE DVT. HEparin level remains therapeutic at 0.4. H/H + platelets are stable and no bleeding noted. Will monitor closely since pt has hx of recent GIB and anemia.  Goal of Therapy:  Heparin level 0.3-0.7 units/ml Monitor platelets by anticoagulation protocol: Yes   Plan:  Continue heparin at 1850 units/hr Daily heparin level and CBC Follow up plans for long term anticoagulation  Salome Arnt, PharmD, BCPS Pager # [phone] 08/03/2015 8:11 AM  Addendum:  Starting warfarin for DVT and afib. No baseline INR available. Pt will require 5 days of overlap therapy with warfarin and a parenteral anticoagulant. Pt is also on fluconazole which may increase the INR.   Plan: - Warfarin 5mg  PO x 1 tonight - Daily INR starting today - Warfarin book + video to patient and family  Salome Arnt, PharmD, BCPS Pager # [phone] 08/03/2015 1:45 PM

## 2015-08-03 NOTE — Progress Notes (Addendum)
ANTIBIOTIC CONSULT NOTE - Follow Up Consult  Pharmacy Consult for Vancomycin Indication: MRSA PNA  No Known Allergies  Patient Measurements: Height: 6\' 1"  (185.4 cm) Weight: (!) 360 lb (163.295 kg) IBW/kg (Calculated) : 79.9  Vital Signs: Temp: 98.1 F (36.7 C) (11/19 0422) Temp Source: Axillary (11/19 0422) BP: 115/65 mmHg (11/19 0422) Pulse Rate: 79 (11/19 0422)  Labs:  Recent Labs  08/01/15 0324 08/02/15 0515 08/02/15 1257 08/03/15 0530  HGB 7.7* 7.4*  --  7.4*  HCT 26.8* 25.3*  --  26.0*  PLT 325 327  --  328  HEPARINUNFRC 0.46 0.39  --  0.40  CREATININE 0.45*  --  0.42* 0.40*    Estimated Creatinine Clearance: 133.8 mL/min (by C-G formula based on Cr of 0.4).  Assessment: Pt on Vancomycin (Day #8) for HCAP. Noted that Vancomycin to stop tomorrow 11/20. Afeb. WBC wnl. Vancomycin trough remains supratherapeutic (23) on Vancomycin 1gm IV q12h - trough drawn appropriately. 0600 vancomycin dose already given this a.m.  Goal of Therapy:  Vanc trough 15-20 mcg/ml   Plan:  Change vancomycin to 1250mg  IV q24h x 1 more dose on 11/20  No more levels  Sherlon Handing, PharmD, BCPS Clinical pharmacist, pager [phone] 08/03/2015 6:20 AM  Addendum: Planning to continue vancomycin for a total of 14 days. Will continue vancomycin 1250mg  IV Q24H through 11/25.  Salome Arnt, PharmD, BCPS Pager # [phone] 08/03/2015 8:06 AM

## 2015-08-03 NOTE — Progress Notes (Addendum)
Progress Note   Thomas Zuniga [id number] DOB: 1940-12-23 DOA: 07/27/2015 PCP: Verneita Griffes, MD   Brief Narrative:   Thomas Zuniga is an 74 y.o. male with a PMH of depression, type 2 diabetes without complication, atrial fibrillation, hypertension and ventricular tachycardia status post pacemaker, COPD, chronic respiratory failure/ventilator dependent status post tracheostomy, hyperlipidemia and quadriplegia status post fall/C-spine surgery, recent treatment for MRSA pneumonia, who was admitted 07/27/15 secondary to repeated desaturations while on ventilator at [facility]. Chest x-ray was concerning for persistent pneumonia.  Procedure/Significant Events: 11/09 - Foley change 11/12 - CTA Chest - No PE.  -Moderate right pleural effusion with peripheral rind of enhancement./Right lower lobe consolidation.  -Enlarged right hilar & mediastinal lymph nodes.  11/13 - Admit to Hospital from [facility] 11/13 - venous duplex - extensive L LE DVT 11/15 right pleural fluid NGTD  Assessment/Plan:   Sepsis/Acute on chronic hypoxic respiratory failure w/ chronic hypercarbic respiratory failure secondary to MRSA HCAP and complicated Proteus/Pseudomonas UTI - Serum lactate and Pro calcitonin elevated on admission. Source felt to be pneumonia versus UTI. - CT angiogram done 07/27/15: Findings concerning for aspiration pneumonia. Speech therapy following. - Continue vancomycin and Rocephin. Recently treated for MRSA pneumonia. - Given 1 dose of fosfomycin 08/02/15 to cover Pseudomonas in the urine.  Culture data: 11/12 blood left hand NGTD 11/12 blood left hand positive coag negative staph 11/12 urine positive Proteus Mirabilis/Pseudomonas aeruginosa 11/12 trach aspirate positive MRSA  Active problems:  Diarrhea - Flexiseal in place with watery stools.  Not on laxatives.  Check C. Diff.  Morbid Obesity/dysphasia - Being followed by speech therapy and dietitian. Weight is 360 pounds. -  Ice chips with supervision only. Continue tube feeds per dietitian recommendations. - Repeat FEES beginning next week (Mon or Tues) for initiation of food/beverage, per ST.   Lymphadenopathy - Status post thoracentesis 07/30/15 for further evaluation. Nondiagnostic. - Dr. Ashok Cordia recommends waiting for resolution of MRSA pneumonia to determine if/when BAL required for definitive diagnosis.  Right Pleural Effusion - Trapped lung versus parapneumonic effusion. - Status post thoracentesis per IR 07/30/15; nondiagnostic. Exudative. Pleural fluid culture still pending, but no growth to date. - PCCM to consider TCTS evaluation.   Trach dependent  - Previously doing 12hr T bar trials - vent and trach care per PCCM. - Continue to attempt to wean on CPAP/PS. - Status post Shiley #6 Cuffed Prox XLT Trach (from outside) placement. - Continuing to wean FiO2 for saturation greater than 94%. Continue nebs.  COPD - No evidence of acute exacerbation this time. Continue supplemental oxygen and nebs.  Chronic hypotension - Treated with Midodrine 5 mg BID.  1/2 blood cx positive coag negative staph  - Most likely contamination.  L LE DVT (groin to knee) - Noted on duplex - unclear chronicity - continue IV heparin. Add coumadin.  Atrial fibrillation - Continue digoxin and metoprolol. Rate controlled. On heparin. Add coumadin.  H/O Ventricular tachycardia - Monitoring on telemetry.  Dyslipidemia  Hypokalemia - Continue to replace.  Hypomagnesemia  - Corrected with supplementation.  Chronic foley due to Neurogenic bladder - Foley changed 07/24/15 per outside records - changed again 07/28/15 due to clogging.  Chronic Dysphagia - PEG dependent  - SLP evaluation performed 08/01/15- continue to utilize PEG for meds and feeds at this time. Nothing by mouth.  Anemia - normocytic - No evidence of significant acute blood loss - follow trend. Hemoglobin and 7 range for past 3 days.  DM2  controlled - Currently being  managed with insulin sensitive SSI every 4 hours. CBGs 101-117. - Hemoglobin A1c= 6.1.  Quadriplegia  - Turn and position every 2 hours. On air mattress.  Sacral decub - Seen by WOC who notes moisture associated skin damage but no breakdown.  DVT prophylaxis - Currently on IV heparin.  Family Communication/Anticipated D/C date and plan/Code Status   Family Communication: Wife at the bedside. Disposition Plan: From [facility].  Will likely need LTAC at D/C. Anticipated D/C date:   Depends on progress with regard to respiratory status stability, likely 2-3 more days. Code Status:     Code Status Orders        Start     Ordered   07/28/15 0125  Full code   Continuous     07/28/15 0126       IV Access:    PICC 08/01/15   Procedures and diagnostic studies:   Ct Angio Chest Pe W/cm &/or Wo Cm  07/27/2015  CLINICAL DATA:  Acute onset of severe hypoxia.  Initial encounter. EXAM: CT ANGIOGRAPHY CHEST WITH CONTRAST TECHNIQUE: Multidetector CT imaging of the chest was performed using the standard protocol during bolus administration of intravenous contrast. Multiplanar CT image reconstructions and MIPs were obtained to evaluate the vascular anatomy. CONTRAST:  154mL OMNIPAQUE IOHEXOL 350 MG/ML SOLN COMPARISON:  Chest radiograph performed earlier today at 4:35 p.m. FINDINGS: There is no evidence of pulmonary embolus. Evaluation for pulmonary embolus is suboptimal in areas of airspace consolidation. There is a complex small to moderate right-sided pleural effusion, with a peripheral rind of enhancement, raising concern for evolving empyema. Underlying dense partial consolidation of the right lower lobe is noted. There is partial opacification of the bronchus to the right lower lobe. This is suspicious for aspiration pneumonia. Mildly prominent right hilar nodes are seen, measuring up to 1.1 cm in short axis, and a 1.4 cm periaortic node is seen. A 1.9 cm right  paratracheal node is noted. Underlying right infrahilar mass cannot be excluded. Minimal left-sided atelectasis is noted.  No pneumothorax is seen. A left-sided chest pacemaker, with a single lead ending at the right ventricle. Diffuse coronary artery calcifications are seen. Biatrial enlargement is noted. There is mild prominence of the proximal aortic arch, measuring up to 3.9 cm. A tracheostomy tube is noted. No axillary lymphadenopathy is seen. The visualized portions of the thyroid gland are unremarkable in appearance. The visualized portions of the liver and spleen are unremarkable. The visualized portions of the pancreas, gallbladder, stomach and adrenal glands are within normal limits. Left-sided perinephric stranding is noted. No acute osseous abnormalities are seen. Cervical fusion hardware is partially imaged. Anterior bridging osteophytes are noted along the thoracic spine. Review of the MIP images confirms the above findings. IMPRESSION: 1. No evidence of pulmonary embolus. 2. Complex small to moderate right-sided pleural effusion, with a peripheral rind of enhancement, concerning for evolving empyema. Underlying dense partial consolidation of the right lower lung lobe, with partial opacification of the associated bronchus, suspicious for aspiration pneumonia. Diagnostic thoracentesis is suggested for further evaluation, given the complexity of the pleural effusion. This would also help to exclude underlying malignancy, given visualized lymphadenopathy. 3. Prominent right hilar and mediastinal nodes seen, measuring up to 1.9 cm in short axis. 4. Mild prominence of the proximal aortic arch, measuring up to 3.9 cm in diameter. 5. Diffuse coronary artery calcifications seen. Electronically Signed   By: Garald Balding M.D.   On: 07/27/2015 22:54   Dg Chest Port 1 View  07/27/2015  CLINICAL DATA:  Respiratory distress, recently diagnosed with pneumonia. Desaturations. EXAM: PORTABLE CHEST 1 VIEW  COMPARISON:  None. FINDINGS: Heart is enlarged. Mediastinal diameter is also somewhat prominent but this is likely accentuated by the supine patient positioning. Left chest wall pacemaker/AICD in place. There is prominence of the right perihilar shadow, of uncertain etiology. Probable small right pleural effusion. Perhaps mild interstitial edema. Right-sided PICC line adequately positioned with tip in the upper SVC. Anterior cervical fusion hardware noted within the lower cervical spine. Old healed fracture of the left clavicle. No acute osseous abnormality seen. IMPRESSION: 1. Cardiomegaly. 2. Prominence of the right perihilar shadow may represent associated central pulmonary vascular congestion related to mild congestive heart failure/volume overload. Perihilar mass or pneumonia cannot be excluded on this single exam. 3. Slight prominence of the mediastinal diameter likely accentuated by the supine patient positioning. If any chest pain, would recommend chest CT angiogram for more definitive characterization. 4. Small right pleural effusion. 5. Probable mild bilateral interstitial edema. Electronically Signed   By: Franki Cabot M.D.   On: 07/27/2015 17:18     Medical Consultants:   Dr.Wesam Kathryne Sharper PCCM  Anti-Infectives:   Zosyn 11/12 >> 11/16 Vanc 11/12 >> Fluconazole 11/13>> Ceftriaxone 11/17>> Fosfomycin x 1 11/18  Subjective:   Pol Hodo feels okay.  No complaints of dyspnea.  Flexiseal in place, watery stools present.  Denies abdominal pain, nausea or vomiting.  Objective:    Filed Vitals:   08/03/15 0352 08/03/15 0422 08/03/15 0500 08/03/15 0736  BP: 106/70 115/65    Pulse: 75 79    Temp:  98.1 F (36.7 C)    TempSrc:  Axillary    Resp: 20 17    Height:      Weight:   163.295 kg (360 lb)   SpO2: 100% 99%  100%    Intake/Output Summary (Last 24 hours) at 08/03/15 0750 Last data filed at 08/03/15 0600  Gross per 24 hour  Intake   4294 ml  Output   2300 ml  Net    1994 ml   Filed Weights   07/31/15 0400 08/01/15 0500 08/03/15 0500  Weight: 128.9 kg (284 lb 2.8 oz) 130.5 kg (287 lb 11.2 oz) 163.295 kg (360 lb)    Exam: Gen:  Morbidly obese Cardiovascular:  HSIR, No M/R/G Respiratory:  Lungs CTAB Gastrointestinal:  Abdomen soft, NT/ND, + BS Extremities:  1+ edema   Data Reviewed:    Labs: Basic Metabolic Panel:  Recent Labs Lab 07/28/15 0254 07/28/15 0334 07/28/15 2345 07/29/15 0157 07/30/15 0535 07/30/15 1245 08/01/15 0324 08/01/15 1320 08/02/15 1257 08/03/15 0530  NA 139  --  143 143 143  --  141  --  142 142  K 4.2  --  2.6* 2.8* 2.8* 3.2* 2.8* 2.9* 3.1* 3.0*  CL 97*  --  102 104 107  --  108  --  109 108  CO2 31  --  28 29 25   --  27  --  28 27  GLUCOSE 90  --  102* 131* 113*  --  115*  --  131* 112*  BUN 25*  --  18 17 16   --  15  --  19 19  CREATININE 0.66  --  0.54* 0.55* 0.43*  --  0.45*  --  0.42* 0.40*  CALCIUM 8.1*  --  8.4* 8.2* 8.2*  --  8.1*  --  8.1* 8.2*  MG  --  2.6* 1.6*  --   --  1.6* 1.4* 2.0  --   --   PHOS 1.8*  --  4.9*  --   --   --   --   --   --   --    GFR Estimated Creatinine Clearance: 133.8 mL/min (by C-G formula based on Cr of 0.4). Liver Function Tests:  Recent Labs Lab 07/27/15 1630 07/28/15 0254 07/30/15 0535  AST 38  --  19  ALT 22  --  14*  ALKPHOS 64  --  46  BILITOT 0.6  --  0.6  PROT 7.2  --  5.9*  ALBUMIN 2.1* 1.9* 1.8*   CBC:  Recent Labs Lab 07/27/15 1630 07/28/15 0334  07/30/15 0535 07/31/15 0355 08/01/15 0324 08/02/15 0515 08/03/15 0530  WBC 12.7* 7.8  < > 10.6* 9.5 10.0 8.9 8.0  NEUTROABS 11.4* 5.7  --   --   --   --   --   --   HGB 8.9* 9.9*  < > 7.6* 8.1* 7.7* 7.4* 7.4*  HCT 29.1* 32.5*  < > 26.4* 28.5* 26.8* 25.3* 26.0*  MCV 89.0 100.0  < > 90.7 91.1 90.8 90.7 91.2  PLT 280 200  < > 349 338 325 327 328  < > = values in this interval not displayed. CBG:  Recent Labs Lab 08/02/15 1312 08/02/15 1657 08/02/15 1954 08/02/15 2350 08/03/15 0416  GLUCAP  117* 108* 102* 116* 101*   Hgb A1c: No results for input(s): HGBA1C in the last 72 hours. Lipid Profile: No results for input(s): CHOL, HDL, LDLCALC, TRIG, CHOLHDL, LDLDIRECT in the last 72 hours. Sepsis Labs:  Recent Labs Lab 07/27/15 1701 07/27/15 2058 07/27/15 2246 07/28/15 0334 07/28/15 2345  07/30/15 0535 07/31/15 0355 08/01/15 0324 08/02/15 0515 08/03/15 0530  PROCALCITON  --   --  2.40  --  2.47  --  2.40  --   --   --   --   WBC  --   --   --  7.8 13.1*  < > 10.6* 9.5 10.0 8.9 8.0  LATICACIDVEN 3.12* 3.32*  --  0.6  --   --   --   --   --   --   --   < > = values in this interval not displayed. Microbiology Recent Results (from the past 240 hour(s))  Blood Culture (routine x 2)     Status: None   Collection Time: 07/27/15  4:30 PM  Result Value Ref Range Status   Specimen Description BLOOD LEFT HAND  Final   Special Requests BOTTLES DRAWN AEROBIC AND ANAEROBIC 5CC  Final   Culture NO GROWTH 5 DAYS  Final   Report Status 08/01/2015 FINAL  Final  Blood Culture (routine x 2)     Status: None   Collection Time: 07/27/15  4:50 PM  Result Value Ref Range Status   Specimen Description BLOOD LEFT HAND  Final   Special Requests IN PEDIATRIC BOTTLE 2CC  Final   Culture  Setup Time   Final    GRAM POSITIVE COCCI IN CLUSTERS AEROBIC BOTTLE ONLY CRITICAL RESULT CALLED TO, READ BACK BY AND VERIFIED WITH: P KEATON,RN AT [id number] 07/28/15 BY L BENFIELD    Culture   Final    STAPHYLOCOCCUS SPECIES (COAGULASE NEGATIVE) THE SIGNIFICANCE OF ISOLATING THIS ORGANISM FROM A SINGLE VENIPUNCTURE CANNOT BE PREDICTED WITHOUT FURTHER CLINICAL AND CULTURE CORRELATION. SUSCEPTIBILITIES AVAILABLE ONLY ON REQUEST.    Report Status 07/30/2015 FINAL  Final  Urine culture     Status:  None   Collection Time: 07/27/15  4:50 PM  Result Value Ref Range Status   Specimen Description URINE, RANDOM  Final   Special Requests NONE  Final   Culture   Final    >=100,000 COLONIES/mL PROTEUS  MIRABILIS 40,000 COLONIES/ml PSEUDOMONAS AERUGINOSA    Report Status 07/31/2015 FINAL  Final   Organism ID, Bacteria PROTEUS MIRABILIS  Final   Organism ID, Bacteria PSEUDOMONAS AERUGINOSA  Final      Susceptibility   Proteus mirabilis - MIC*    AMPICILLIN <=2 SENSITIVE Sensitive     CEFAZOLIN <=4 SENSITIVE Sensitive     CEFTRIAXONE <=1 SENSITIVE Sensitive     CIPROFLOXACIN <=0.25 SENSITIVE Sensitive     GENTAMICIN <=1 SENSITIVE Sensitive     IMIPENEM 8 INTERMEDIATE Intermediate     NITROFURANTOIN 128 RESISTANT Resistant     TRIMETH/SULFA <=20 SENSITIVE Sensitive     AMPICILLIN/SULBACTAM <=2 SENSITIVE Sensitive     PIP/TAZO <=4 SENSITIVE Sensitive     * >=100,000 COLONIES/mL PROTEUS MIRABILIS   Pseudomonas aeruginosa - MIC*    CEFTAZIDIME 2 SENSITIVE Sensitive     CIPROFLOXACIN >=4 RESISTANT Resistant     GENTAMICIN >=16 RESISTANT Resistant     IMIPENEM >=16 RESISTANT Resistant     CEFEPIME 8 SENSITIVE Sensitive     * 40,000 COLONIES/ml PSEUDOMONAS AERUGINOSA  Culture, respiratory (NON-Expectorated)     Status: None   Collection Time: 07/27/15  5:28 PM  Result Value Ref Range Status   Specimen Description TRACHEAL ASPIRATE  Final   Special Requests NONE  Final   Gram Stain   Final    ABUNDANT WBC PRESENT,BOTH PMN AND MONONUCLEAR NO SQUAMOUS EPITHELIAL CELLS SEEN NO ORGANISMS SEEN Performed at [facility]    Culture   Final    MODERATE METHICILLIN RESISTANT STAPHYLOCOCCUS AUREUS Note: RIFAMPIN AND GENTAMICIN SHOULD NOT BE USED AS SINGLE DRUGS FOR TREATMENT OF STAPH INFECTIONS. This organism DOES NOT demonstrate inducible Clindamycin resistance in vitro. CRITICAL RESULT CALLED TO, READ BACK BY AND VERIFIED WITH: NICOLE WALLER AT  [id number] ON 07/30/15 BY WEBSP Performed at [facility]    Report Status 07/30/2015 FINAL  Final   Organism ID, Bacteria METHICILLIN RESISTANT STAPHYLOCOCCUS AUREUS  Final      Susceptibility   Methicillin resistant staphylococcus  aureus - MIC*    CLINDAMYCIN <=0.25 SENSITIVE Sensitive     ERYTHROMYCIN >=8 RESISTANT Resistant     GENTAMICIN <=0.5 SENSITIVE Sensitive     LEVOFLOXACIN >=8 RESISTANT Resistant     OXACILLIN >=4 RESISTANT Resistant     RIFAMPIN <=0.5 SENSITIVE Sensitive     TRIMETH/SULFA >=320 RESISTANT Resistant     VANCOMYCIN 1 SENSITIVE Sensitive     TETRACYCLINE <=1 SENSITIVE Sensitive     * MODERATE METHICILLIN RESISTANT STAPHYLOCOCCUS AUREUS  MRSA PCR Screening     Status: Abnormal   Collection Time: 07/28/15  3:23 AM  Result Value Ref Range Status   MRSA by PCR POSITIVE (A) NEGATIVE Final    Comment:        The GeneXpert MRSA Assay (FDA approved for NASAL specimens only), is one component of a comprehensive MRSA colonization surveillance program. It is not intended to diagnose MRSA infection nor to guide or monitor treatment for MRSA infections. RESULT CALLED TO, READ BACK BY AND VERIFIED WITH: SPOKE TO RB PERRIN,J AT 6:32 ON 07/28/2015 [email or web address]   Body fluid culture     Status: None (Preliminary result)   Collection Time: 07/30/15  3:40 PM  Result  Value Ref Range Status   Specimen Description FLUID RIGHT PLEURAL  Final   Special Requests NONE  Final   Gram Stain   Final    WBC PRESENT, PREDOMINANTLY MONONUCLEAR NO ORGANISMS SEEN CYTOSPIN    Culture NO GROWTH 3 DAYS  Final   Report Status PENDING  Incomplete     Medications:   . antiseptic oral rinse  7 mL Mouth Rinse QID  . chlorhexidine gluconate  15 mL Mouth/Throat BID  . digoxin  0.125 mg Per Tube Daily  . escitalopram  20 mg Per Tube Daily  . feeding supplement (PRO-STAT SUGAR FREE 64)  60 mL Per Tube 5 X Daily  . feeding supplement (VITAL HIGH PROTEIN)  1,000 mL Per Tube Q24H  . fluconazole  200 mg Per Tube Daily  . folic acid  1 mg Per Tube Daily  . free water  200 mL Per Tube 4 times per day  . gabapentin  800 mg Per Tube BID  . insulin aspart  0-9 Units Subcutaneous 6 times per day  . ipratropium-albuterol  3 mL  Nebulization QID  . metoprolol tartrate  12.5 mg Per Tube BID  . midodrine  5 mg Per Tube BID WC  . mirtazapine  15 mg Per Tube QHS  . nystatin   Topical TID  . pantoprazole sodium  40 mg Per Tube BID  . [START ON 08/04/2015] vancomycin  1,250 mg Intravenous Q24H  . vitamin B-12  250 mcg Per Tube Daily   Continuous Infusions: . sodium chloride 50 mL/hr at 08/02/15 2008  . heparin 1,850 Units/hr (08/02/15 2008)    Time spent: 35 minutes.  The patient is medically complex with multiple co-morbidities and is at high risk for clinical deterioration and requires high complexity decision making.    LOS: 6 days   [facility] Pager [phone]. If unable to reach me by pager, please call my cell phone at [phone].  *Please refer to [email or web address], password [email or web address] to get updated schedule on who will round on this patient, as hospitalists switch teams weekly. If 7PM-7AM, please contact night-coverage at [email or web address], password [email or web address] for any overnight needs.  08/03/2015, 7:50 AM

## 2015-08-04 LAB — BASIC METABOLIC PANEL
Anion gap: 5 (ref 5–15)
BUN: 16 mg/dL (ref 6–20)
CALCIUM: 8.4 mg/dL — AB (ref 8.9–10.3)
CO2: 28 mmol/L (ref 22–32)
CREATININE: 0.38 mg/dL — AB (ref 0.61–1.24)
Chloride: 109 mmol/L (ref 101–111)
GFR calc non Af Amer: >60 mL/min (ref >60)
Glucose, Bld: 112 mg/dL — ABNORMAL HIGH (ref 65–99)
Potassium: 3.5 mmol/L (ref 3.5–5.1)
SODIUM: 142 mmol/L (ref 135–145)

## 2015-08-04 LAB — PROTIME-INR
INR: 1.26 (ref 0.00–1.49)
PROTHROMBIN TIME: 15.9 s — AB (ref 11.6–15.2)

## 2015-08-04 LAB — GLUCOSE, CAPILLARY
GLUCOSE-CAPILLARY: 102 mg/dL — AB (ref 65–99)
GLUCOSE-CAPILLARY: 106 mg/dL — AB (ref 65–99)
GLUCOSE-CAPILLARY: 107 mg/dL — AB (ref 65–99)
GLUCOSE-CAPILLARY: 114 mg/dL — AB (ref 65–99)
GLUCOSE-CAPILLARY: 122 mg/dL — AB (ref 65–99)
Glucose-Capillary: 115 mg/dL — ABNORMAL HIGH (ref 65–99)

## 2015-08-04 LAB — CBC
HCT: 26.3 % — ABNORMAL LOW (ref 39.0–52.0)
Hemoglobin: 7.6 g/dL — ABNORMAL LOW (ref 13.0–17.0)
MCH: 26.3 pg (ref 26.0–34.0)
MCHC: 28.9 g/dL — ABNORMAL LOW (ref 30.0–36.0)
MCV: 91 fL (ref 78.0–100.0)
PLATELETS: 340 10*3/uL (ref 150–400)
RBC: 2.89 MIL/uL — AB (ref 4.22–5.81)
RDW: 17.6 % — AB (ref 11.5–15.5)
WBC: 7.3 10*3/uL (ref 4.0–10.5)

## 2015-08-04 LAB — HEPARIN LEVEL (UNFRACTIONATED): Heparin Unfractionated: 0.43 IU/mL (ref 0.30–0.70)

## 2015-08-04 MED ORDER — WARFARIN SODIUM 5 MG PO TABS
5.0000 mg | ORAL_TABLET | Freq: Once | ORAL | Status: AC
  Administered 2015-08-04: 5 mg via ORAL
  Filled 2015-08-04: qty 1

## 2015-08-04 NOTE — Progress Notes (Signed)
Went in patient room to change his inner cannula on his tracheostomy. Patient states that he already had it changed. Informed him that I know another section of the setup was changed but not the inner cannula. Called Respiratory to see if they did it and was told they did not. Informed patient but patient was adamant that it had been changed and did not want it changed again. Will pass on this information to night shift.  Milford Cage, RN

## 2015-08-04 NOTE — Progress Notes (Signed)
Progress Note   Thomas Zuniga [id number] DOB: 09/29/1940 DOA: 07/27/2015 PCP: Verneita Griffes, MD   Brief Narrative:   Thomas Zuniga is an 74 y.o. male with a PMH of depression, type 2 diabetes without complication, atrial fibrillation, hypertension and ventricular tachycardia status post pacemaker, COPD, chronic respiratory failure/ventilator dependent status post tracheostomy, hyperlipidemia and quadriplegia status post fall/C-spine surgery, recent treatment for MRSA pneumonia, who was admitted 07/27/15 secondary to repeated desaturations while on ventilator at [place]. Chest x-ray was concerning for persistent pneumonia. Oxygen saturations have improved since admission and he continues to work with respiratory therapy/pulmonology for vent weaning.  Procedure/Significant Events: 11/09 - Foley change 11/12 - CTA Chest - No PE.  -Moderate right pleural effusion with peripheral rind of enhancement./Right lower lobe consolidation.  -Enlarged right hilar & mediastinal lymph nodes.  11/13 - Admit to Hospital from [facility] 11/13 - venous duplex - extensive L LE DVT 11/15 right pleural fluid - negative cultures  Assessment/Plan:   Sepsis/Acute on chronic hypoxic respiratory failure w/ chronic hypercarbic respiratory failure secondary to MRSA HCAP and complicated Proteus/Pseudomonas UTI - Serum lactate and Pro calcitonin elevated on admission. Source felt to be pneumonia versus UTI. - CT angiogram done 07/27/15: Findings concerning for aspiration pneumonia. Speech therapy following. Still NPO. - Continue vancomycin and Rocephin. Recently treated for MRSA pneumonia. - Urine cultures grew Proteus and Pseudomonas. Given 1 dose of fosfomycin 08/02/15 to cover Pseudomonas in the urine.  Culture data: 11/12 blood left hand negative 11/12 blood left hand positive coag negative staph 11/12 urine positive Proteus Mirabilis/Pseudomonas aeruginosa 11/12 trach aspirate positive MRSA 11/15  pleural fluid cultures negative  Active problems:  Diarrhea - Flexiseal in place with watery stools.  Not on laxatives.  C. difficile studies negative on 08/03/15.  Morbid Obesity/dysphasia - Being followed by speech therapy and dietitian. Weight is 360 pounds. - Ice chips with supervision only. Continue tube feeds per dietitian recommendations. - Repeat FEES beginning next week (Mon or Tues) for initiation of food/beverage, per ST.   Lymphadenopathy - Status post thoracentesis 07/30/15 for further evaluation. Nondiagnostic. - Dr. Ashok Cordia recommends waiting for resolution of MRSA pneumonia to determine if/when BAL required for definitive diagnosis.  Right Pleural Effusion - Trapped lung versus parapneumonic effusion. - Status post thoracentesis per IR 07/30/15; nondiagnostic. Exudative. Pleural fluid culture negative. - PCCM to consider TCTS evaluation.   Trach dependent  - Previously doing 12hr T bar trials - vent and trach care per PCCM. - Continue to attempt to wean on CPAP/PS. - Status post Shiley #6 Cuffed Prox XLT Trach (from outside) placement. - Continuing to wean FiO2 for saturation greater than 94%. Continue nebs.  COPD - No evidence of acute exacerbation this time. Continue supplemental oxygen and nebs.  Chronic hypotension - Treated with Midodrine 5 mg BID.  1/2 blood cx positive coag negative staph  - Most likely contamination.  L LE DVT (groin to knee) - Noted on duplex - unclear chronicity - continue IV heparin until INR therapeutic 48 hours. Coumadin started 08/03/15. - Rationale for coumadin discussed with the patient's wife (previously on coumadin for afib, taken off by PCP for unclear reasons).  Atrial fibrillation / probable acute on chronic diastolic CHF - CXR done on admission showed pulmonary edema. - Continue digoxin and metoprolol. Rate controlled. Continue heparin/Coumadin. - 2 D Echo done 07/30/15.  EF 0000000, diastolic function could not be  ascertained. No regional wall motion abnormalities.  H/O Ventricular tachycardia - Monitoring on telemetry.  Dyslipidemia  Hypokalemia - Potassium 3.5 today. Continue 40 mEq twice a day given ongoing diarrhea.  Hypomagnesemia  - Corrected with supplementation.  Chronic foley due to Neurogenic bladder - Foley changed 07/24/15 per outside records - changed again 07/28/15 due to clogging.  Chronic Dysphagia - PEG dependent  - SLP evaluation performed 08/01/15- continue to utilize PEG for meds and feeds at this time. Nothing by mouth.  Anemia - normocytic - No evidence of significant acute blood loss - follow trend. Hemoglobin and 7 range for past 4 days.  DM2 controlled - Currently being managed with insulin sensitive SSI every 4 hours. CBGs 99-110. - Hemoglobin A1c= 6.1.  Quadriplegia  - Turn and position every 2 hours. On air mattress.  Sacral decub - Seen by WOC who notes moisture associated skin damage but no breakdown.  DVT prophylaxis - Currently on IV heparin.  Family Communication/Anticipated D/C date and plan/Code Status   Family Communication: Wife at the bedside. Disposition Plan: From [facility].  Will likely need LTAC at D/C. Anticipated D/C date:   Depends on progress with regard to respiratory status stability, likely 2-3 more days. Code Status:     Code Status Orders        Start     Ordered   07/28/15 0125  Full code   Continuous     07/28/15 0126       IV Access:    PICC 08/01/15   Procedures and diagnostic studies:   Ct Angio Chest Pe W/cm &/or Wo Cm  07/27/2015  CLINICAL DATA:  Acute onset of severe hypoxia.  Initial encounter. EXAM: CT ANGIOGRAPHY CHEST WITH CONTRAST TECHNIQUE: Multidetector CT imaging of the chest was performed using the standard protocol during bolus administration of intravenous contrast. Multiplanar CT image reconstructions and MIPs were obtained to evaluate the vascular anatomy. CONTRAST:  118mL OMNIPAQUE IOHEXOL  350 MG/ML SOLN COMPARISON:  Chest radiograph performed earlier today at 4:35 p.m. FINDINGS: There is no evidence of pulmonary embolus. Evaluation for pulmonary embolus is suboptimal in areas of airspace consolidation. There is a complex small to moderate right-sided pleural effusion, with a peripheral rind of enhancement, raising concern for evolving empyema. Underlying dense partial consolidation of the right lower lobe is noted. There is partial opacification of the bronchus to the right lower lobe. This is suspicious for aspiration pneumonia. Mildly prominent right hilar nodes are seen, measuring up to 1.1 cm in short axis, and a 1.4 cm periaortic node is seen. A 1.9 cm right paratracheal node is noted. Underlying right infrahilar mass cannot be excluded. Minimal left-sided atelectasis is noted.  No pneumothorax is seen. A left-sided chest pacemaker, with a single lead ending at the right ventricle. Diffuse coronary artery calcifications are seen. Biatrial enlargement is noted. There is mild prominence of the proximal aortic arch, measuring up to 3.9 cm. A tracheostomy tube is noted. No axillary lymphadenopathy is seen. The visualized portions of the thyroid gland are unremarkable in appearance. The visualized portions of the liver and spleen are unremarkable. The visualized portions of the pancreas, gallbladder, stomach and adrenal glands are within normal limits. Left-sided perinephric stranding is noted. No acute osseous abnormalities are seen. Cervical fusion hardware is partially imaged. Anterior bridging osteophytes are noted along the thoracic spine. Review of the MIP images confirms the above findings. IMPRESSION: 1. No evidence of pulmonary embolus. 2. Complex small to moderate right-sided pleural effusion, with a peripheral rind of enhancement, concerning for evolving empyema. Underlying dense partial consolidation of the right lower lung  lobe, with partial opacification of the associated bronchus,  suspicious for aspiration pneumonia. Diagnostic thoracentesis is suggested for further evaluation, given the complexity of the pleural effusion. This would also help to exclude underlying malignancy, given visualized lymphadenopathy. 3. Prominent right hilar and mediastinal nodes seen, measuring up to 1.9 cm in short axis. 4. Mild prominence of the proximal aortic arch, measuring up to 3.9 cm in diameter. 5. Diffuse coronary artery calcifications seen. Electronically Signed   By: Garald Balding M.D.   On: 07/27/2015 22:54   Dg Chest Port 1 View  07/27/2015  CLINICAL DATA:  Respiratory distress, recently diagnosed with pneumonia. Desaturations. EXAM: PORTABLE CHEST 1 VIEW COMPARISON:  None. FINDINGS: Heart is enlarged. Mediastinal diameter is also somewhat prominent but this is likely accentuated by the supine patient positioning. Left chest wall pacemaker/AICD in place. There is prominence of the right perihilar shadow, of uncertain etiology. Probable small right pleural effusion. Perhaps mild interstitial edema. Right-sided PICC line adequately positioned with tip in the upper SVC. Anterior cervical fusion hardware noted within the lower cervical spine. Old healed fracture of the left clavicle. No acute osseous abnormality seen. IMPRESSION: 1. Cardiomegaly. 2. Prominence of the right perihilar shadow may represent associated central pulmonary vascular congestion related to mild congestive heart failure/volume overload. Perihilar mass or pneumonia cannot be excluded on this single exam. 3. Slight prominence of the mediastinal diameter likely accentuated by the supine patient positioning. If any chest pain, would recommend chest CT angiogram for more definitive characterization. 4. Small right pleural effusion. 5. Probable mild bilateral interstitial edema. Electronically Signed   By: Franki Cabot M.D.   On: 07/27/2015 17:18     Medical Consultants:   Dr.Wesam Kathryne Sharper PCCM  Anti-Infectives:   Zosyn  11/12 >> 11/16 Vanc 11/12 >> Fluconazole 11/13>> Ceftriaxone 11/17>> Fosfomycin x 1 11/18  Subjective:   Thomas Zuniga feels well.  His wife reports he is more alert, and has been watching television.  No complaints of dyspnea.  Flexiseal remains in place, watery stools present.  Denies abdominal pain, nausea or vomiting.  Objective:    Filed Vitals:   08/04/15 0009 08/04/15 0332 08/04/15 0427 08/04/15 0500  BP: 102/58  106/65   Pulse: 68 87 75   Temp: 98.7 F (37.1 C)  98.9 F (37.2 C)   TempSrc: Oral  Axillary   Resp: 19 20 17    Height:      Weight:    163.295 kg (360 lb)  SpO2: 97% 97% 99%     Intake/Output Summary (Last 24 hours) at 08/04/15 0717 Last data filed at 08/04/15 0600  Gross per 24 hour  Intake   2487 ml  Output   3000 ml  Net   -513 ml   Filed Weights   08/01/15 0500 08/03/15 0500 08/04/15 0500  Weight: 130.5 kg (287 lb 11.2 oz) 163.295 kg (360 lb) 163.295 kg (360 lb)    Exam: Gen:  Morbidly obese Cardiovascular:  HSIR, No M/R/G Respiratory:  Lungs CTAB Gastrointestinal:  Abdomen soft, NT/ND, + BS Extremities:  1+ edema   Data Reviewed:    Labs: Basic Metabolic Panel:  Recent Labs Lab 07/28/15 2345  07/30/15 0535 07/30/15 1245 08/01/15 0324 08/01/15 1320 08/02/15 1257 08/03/15 0530 08/04/15 0500  NA 143  < > 143  --  141  --  142 142 142  K 2.6*  < > 2.8* 3.2* 2.8* 2.9* 3.1* 3.0* 3.5  CL 102  < > 107  --  108  --  109 108 109  CO2 28  < > 25  --  27  --  28 27 28   GLUCOSE 102*  < > 113*  --  115*  --  131* 112* 112*  BUN 18  < > 16  --  15  --  19 19 16   CREATININE 0.54*  < > 0.43*  --  0.45*  --  0.42* 0.40* 0.38*  CALCIUM 8.4*  < > 8.2*  --  8.1*  --  8.1* 8.2* 8.4*  MG 1.6*  --   --  1.6* 1.4* 2.0  --   --   --   PHOS 4.9*  --   --   --   --   --   --   --   --   < > = values in this interval not displayed. GFR Estimated Creatinine Clearance: 133.8 mL/min (by C-G formula based on Cr of 0.38). Liver Function  Tests:  Recent Labs Lab 07/30/15 0535  AST 19  ALT 14*  ALKPHOS 46  BILITOT 0.6  PROT 5.9*  ALBUMIN 1.8*   CBC:  Recent Labs Lab 07/31/15 0355 08/01/15 0324 08/02/15 0515 08/03/15 0530 08/04/15 0500  WBC 9.5 10.0 8.9 8.0 7.3  HGB 8.1* 7.7* 7.4* 7.4* 7.6*  HCT 28.5* 26.8* 25.3* 26.0* 26.3*  MCV 91.1 90.8 90.7 91.2 91.0  PLT 338 325 327 328 340   CBG:  Recent Labs Lab 08/03/15 1239 08/03/15 1804 08/03/15 2014 08/04/15 0007 08/04/15 0424  GLUCAP 109* 99 110* 106* 102*   Sepsis Labs:  Recent Labs Lab 07/28/15 2345  07/30/15 0535  08/01/15 0324 08/02/15 0515 08/03/15 0530 08/04/15 0500  PROCALCITON 2.47  --  2.40  --   --   --   --   --   WBC 13.1*  < > 10.6*  < > 10.0 8.9 8.0 7.3  < > = values in this interval not displayed. Microbiology Recent Results (from the past 240 hour(s))  Blood Culture (routine x 2)     Status: None   Collection Time: 07/27/15  4:30 PM  Result Value Ref Range Status   Specimen Description BLOOD LEFT HAND  Final   Special Requests BOTTLES DRAWN AEROBIC AND ANAEROBIC 5CC  Final   Culture NO GROWTH 5 DAYS  Final   Report Status 08/01/2015 FINAL  Final  Blood Culture (routine x 2)     Status: None   Collection Time: 07/27/15  4:50 PM  Result Value Ref Range Status   Specimen Description BLOOD LEFT HAND  Final   Special Requests IN PEDIATRIC BOTTLE 2CC  Final   Culture  Setup Time   Final    GRAM POSITIVE COCCI IN CLUSTERS AEROBIC BOTTLE ONLY CRITICAL RESULT CALLED TO, READ BACK BY AND VERIFIED WITH: P KEATON,RN AT [id number] 07/28/15 BY L BENFIELD    Culture   Final    STAPHYLOCOCCUS SPECIES (COAGULASE NEGATIVE) THE SIGNIFICANCE OF ISOLATING THIS ORGANISM FROM A SINGLE VENIPUNCTURE CANNOT BE PREDICTED WITHOUT FURTHER CLINICAL AND CULTURE CORRELATION. SUSCEPTIBILITIES AVAILABLE ONLY ON REQUEST.    Report Status 07/30/2015 FINAL  Final  Urine culture     Status: None   Collection Time: 07/27/15  4:50 PM  Result Value Ref Range  Status   Specimen Description URINE, RANDOM  Final   Special Requests NONE  Final   Culture   Final    >=100,000 COLONIES/mL PROTEUS MIRABILIS 40,000 COLONIES/ml PSEUDOMONAS AERUGINOSA    Report Status 07/31/2015 FINAL  Final  Organism ID, Bacteria PROTEUS MIRABILIS  Final   Organism ID, Bacteria PSEUDOMONAS AERUGINOSA  Final      Susceptibility   Proteus mirabilis - MIC*    AMPICILLIN <=2 SENSITIVE Sensitive     CEFAZOLIN <=4 SENSITIVE Sensitive     CEFTRIAXONE <=1 SENSITIVE Sensitive     CIPROFLOXACIN <=0.25 SENSITIVE Sensitive     GENTAMICIN <=1 SENSITIVE Sensitive     IMIPENEM 8 INTERMEDIATE Intermediate     NITROFURANTOIN 128 RESISTANT Resistant     TRIMETH/SULFA <=20 SENSITIVE Sensitive     AMPICILLIN/SULBACTAM <=2 SENSITIVE Sensitive     PIP/TAZO <=4 SENSITIVE Sensitive     * >=100,000 COLONIES/mL PROTEUS MIRABILIS   Pseudomonas aeruginosa - MIC*    CEFTAZIDIME 2 SENSITIVE Sensitive     CIPROFLOXACIN >=4 RESISTANT Resistant     GENTAMICIN >=16 RESISTANT Resistant     IMIPENEM >=16 RESISTANT Resistant     CEFEPIME 8 SENSITIVE Sensitive     * 40,000 COLONIES/ml PSEUDOMONAS AERUGINOSA  Culture, respiratory (NON-Expectorated)     Status: None   Collection Time: 07/27/15  5:28 PM  Result Value Ref Range Status   Specimen Description TRACHEAL ASPIRATE  Final   Special Requests NONE  Final   Gram Stain   Final    ABUNDANT WBC PRESENT,BOTH PMN AND MONONUCLEAR NO SQUAMOUS EPITHELIAL CELLS SEEN NO ORGANISMS SEEN Performed at [facility]    Culture   Final    MODERATE METHICILLIN RESISTANT STAPHYLOCOCCUS AUREUS Note: RIFAMPIN AND GENTAMICIN SHOULD NOT BE USED AS SINGLE DRUGS FOR TREATMENT OF STAPH INFECTIONS. This organism DOES NOT demonstrate inducible Clindamycin resistance in vitro. CRITICAL RESULT CALLED TO, READ BACK BY AND VERIFIED WITH: NICOLE WALLER AT  [id number] ON 07/30/15 BY WEBSP Performed at [facility]    Report Status 07/30/2015 FINAL  Final    Organism ID, Bacteria METHICILLIN RESISTANT STAPHYLOCOCCUS AUREUS  Final      Susceptibility   Methicillin resistant staphylococcus aureus - MIC*    CLINDAMYCIN <=0.25 SENSITIVE Sensitive     ERYTHROMYCIN >=8 RESISTANT Resistant     GENTAMICIN <=0.5 SENSITIVE Sensitive     LEVOFLOXACIN >=8 RESISTANT Resistant     OXACILLIN >=4 RESISTANT Resistant     RIFAMPIN <=0.5 SENSITIVE Sensitive     TRIMETH/SULFA >=320 RESISTANT Resistant     VANCOMYCIN 1 SENSITIVE Sensitive     TETRACYCLINE <=1 SENSITIVE Sensitive     * MODERATE METHICILLIN RESISTANT STAPHYLOCOCCUS AUREUS  MRSA PCR Screening     Status: Abnormal   Collection Time: 07/28/15  3:23 AM  Result Value Ref Range Status   MRSA by PCR POSITIVE (A) NEGATIVE Final    Comment:        The GeneXpert MRSA Assay (FDA approved for NASAL specimens only), is one component of a comprehensive MRSA colonization surveillance program. It is not intended to diagnose MRSA infection nor to guide or monitor treatment for MRSA infections. RESULT CALLED TO, READ BACK BY AND VERIFIED WITH: SPOKE TO RB PERRIN,J AT 6:32 ON 07/28/2015 [email or web address]   Body fluid culture     Status: None   Collection Time: 07/30/15  3:40 PM  Result Value Ref Range Status   Specimen Description FLUID RIGHT PLEURAL  Final   Special Requests NONE  Final   Gram Stain   Final    WBC PRESENT, PREDOMINANTLY MONONUCLEAR NO ORGANISMS SEEN CYTOSPIN    Culture NO GROWTH 3 DAYS  Final   Report Status 08/03/2015 FINAL  Final  C difficile quick scan  w PCR reflex     Status: None   Collection Time: 08/03/15  3:33 PM  Result Value Ref Range Status   C Diff antigen NEGATIVE NEGATIVE Final   C Diff toxin NEGATIVE NEGATIVE Final   C Diff interpretation Negative for toxigenic C. difficile  Final     Medications:   . antiseptic oral rinse  7 mL Mouth Rinse QID  . chlorhexidine gluconate  15 mL Mouth/Throat BID  . digoxin  0.125 mg Per Tube Daily  . escitalopram  20 mg Per Tube Daily   . feeding supplement (PRO-STAT SUGAR FREE 64)  60 mL Per Tube 5 X Daily  . feeding supplement (VITAL HIGH PROTEIN)  1,000 mL Per Tube Q24H  . fluconazole  200 mg Per Tube Daily  . folic acid  1 mg Per Tube Daily  . free water  200 mL Per Tube 4 times per day  . gabapentin  800 mg Per Tube BID  . insulin aspart  0-9 Units Subcutaneous 6 times per day  . ipratropium-albuterol  3 mL Nebulization QID  . metoprolol tartrate  12.5 mg Per Tube BID  . midodrine  5 mg Per Tube BID WC  . mirtazapine  15 mg Per Tube QHS  . nystatin   Topical TID  . pantoprazole sodium  40 mg Per Tube BID  . potassium chloride  40 mEq Per Tube BID  . vancomycin  1,250 mg Intravenous Q24H  . vitamin B-12  250 mcg Per Tube Daily  . warfarin   Does not apply Once  . Warfarin - Pharmacist Dosing Inpatient   Does not apply q1800   Continuous Infusions: . sodium chloride 50 mL/hr at 08/03/15 1851  . heparin 1,850 Units/hr (08/04/15 0037)    Time spent: 35 minutes.  The patient is medically complex with multiple co-morbidities and is at high risk for clinical deterioration and requires high complexity decision making.    LOS: 7 days   [facility] Pager [phone]. If unable to reach me by pager, please call my cell phone at [phone].  *Please refer to [email or web address], password [email or web address] to get updated schedule on who will round on this patient, as hospitalists switch teams weekly. If 7PM-7AM, please contact night-coverage at [email or web address], password [email or web address] for any overnight needs.  08/04/2015, 7:17 AM

## 2015-08-04 NOTE — Progress Notes (Signed)
ANTICOAGULATION CONSULT NOTE - Follow Up Consult  Pharmacy Consult for Heparin + warfarin Indication: DVT  No Known Allergies  Patient Measurements: Height: 6\' 1"  (185.4 cm) Weight: (!) 360 lb (163.295 kg) IBW/kg (Calculated) : 79.9 Heparin Dosing Weight: 106  Vital Signs: Temp: 98.9 F (37.2 C) (11/20 0427) Temp Source: Axillary (11/20 0427) BP: 106/65 mmHg (11/20 0427) Pulse Rate: 75 (11/20 0427)  Labs:  Recent Labs  08/02/15 0515 08/02/15 1257 08/03/15 0530 08/03/15 1518 08/04/15 0500  HGB 7.4*  --  7.4*  --  7.6*  HCT 25.3*  --  26.0*  --  26.3*  PLT 327  --  328  --  340  LABPROT  --   --   --  16.2* 15.9*  INR  --   --   --  1.28 1.26  HEPARINUNFRC 0.39  --  0.40  --  0.43  CREATININE  --  0.42* 0.40*  --  0.38*    Estimated Creatinine Clearance: 133.8 mL/min (by C-G formula based on Cr of 0.38).  Assessment: 74 yo M continues on heparin for LLE DVT. Heparin level remains therapeutic at 0.43. H/H + platelets are stable and no bleeding noted. Will monitor closely since pt has hx of recent GIB and anemia. Pt was also started on warfarin (D#2/5 overlap). INR remains low as expected at 1.26.   Goal of Therapy:  INR 2-3 Heparin level 0.3-0.7 units/ml Monitor platelets by anticoagulation protocol: Yes   Plan:  Continue heparin at 1850 units/hr Daily heparin level and CBC Repeat warfarin 5mg  PO x 1 tonight Daily INR  Salome Arnt, PharmD, BCPS Pager # [phone] 08/04/2015 8:35 AM

## 2015-08-05 DIAGNOSIS — A4102 Sepsis due to Methicillin resistant Staphylococcus aureus: Principal | ICD-10-CM

## 2015-08-05 DIAGNOSIS — Z930 Tracheostomy status: Secondary | ICD-10-CM

## 2015-08-05 DIAGNOSIS — A419 Sepsis, unspecified organism: Secondary | ICD-10-CM

## 2015-08-05 LAB — BASIC METABOLIC PANEL
ANION GAP: 4 — AB (ref 5–15)
BUN: 19 mg/dL (ref 6–20)
CALCIUM: 8.5 mg/dL — AB (ref 8.9–10.3)
CHLORIDE: 108 mmol/L (ref 101–111)
CO2: 29 mmol/L (ref 22–32)
Creatinine, Ser: 0.39 mg/dL — ABNORMAL LOW (ref 0.61–1.24)
GFR calc non Af Amer: >60 mL/min (ref >60)
Glucose, Bld: 117 mg/dL — ABNORMAL HIGH (ref 65–99)
Potassium: 3.6 mmol/L (ref 3.5–5.1)
SODIUM: 141 mmol/L (ref 135–145)

## 2015-08-05 LAB — CBC
HEMATOCRIT: 26 % — AB (ref 39.0–52.0)
HEMOGLOBIN: 7.6 g/dL — AB (ref 13.0–17.0)
MCH: 26.5 pg (ref 26.0–34.0)
MCHC: 29.2 g/dL — ABNORMAL LOW (ref 30.0–36.0)
MCV: 90.6 fL (ref 78.0–100.0)
Platelets: 334 10*3/uL (ref 150–400)
RBC: 2.87 MIL/uL — ABNORMAL LOW (ref 4.22–5.81)
RDW: 17.8 % — ABNORMAL HIGH (ref 11.5–15.5)
WBC: 7.5 10*3/uL (ref 4.0–10.5)

## 2015-08-05 LAB — URINALYSIS, ROUTINE W REFLEX MICROSCOPIC
Bilirubin Urine: NEGATIVE
GLUCOSE, UA: NEGATIVE mg/dL
KETONES UR: NEGATIVE mg/dL
Nitrite: NEGATIVE
PROTEIN: NEGATIVE mg/dL
Specific Gravity, Urine: 1.014 (ref 1.005–1.030)
pH: 6.5 (ref 5.0–8.0)

## 2015-08-05 LAB — OTHER BODY FLUID CHEMISTRY

## 2015-08-05 LAB — PROTIME-INR
INR: 1.23 (ref 0.00–1.49)
PROTHROMBIN TIME: 15.7 s — AB (ref 11.6–15.2)

## 2015-08-05 LAB — GLUCOSE, CAPILLARY
GLUCOSE-CAPILLARY: 112 mg/dL — AB (ref 65–99)
GLUCOSE-CAPILLARY: 115 mg/dL — AB (ref 65–99)
GLUCOSE-CAPILLARY: 128 mg/dL — AB (ref 65–99)
GLUCOSE-CAPILLARY: 106 mg/dL — AB (ref 65–99)
Glucose-Capillary: 104 mg/dL — ABNORMAL HIGH (ref 65–99)
Glucose-Capillary: 111 mg/dL — ABNORMAL HIGH (ref 65–99)

## 2015-08-05 LAB — URINE MICROSCOPIC-ADD ON

## 2015-08-05 LAB — HEPARIN LEVEL (UNFRACTIONATED): HEPARIN UNFRACTIONATED: 0.38 [IU]/mL (ref 0.30–0.70)

## 2015-08-05 MED ORDER — WARFARIN SODIUM 6 MG PO TABS
6.0000 mg | ORAL_TABLET | Freq: Once | ORAL | Status: AC
  Administered 2015-08-05: 6 mg via ORAL
  Filled 2015-08-05: qty 1

## 2015-08-05 NOTE — Progress Notes (Signed)
[facility] TEAM 1 - Stepdown/ICU TEAM  Progress Note   Felder Criscione [id number] DOB: [birth year] DOA: 07/27/2015 PCP: Verneita Griffes, MD   Brief Narrative:   Thomas Zuniga is an 74 y.o. male with a PMH of depression, type 2 diabetes without complication, atrial fibrillation, hypertension and ventricular tachycardia status post pacemaker, COPD, chronic respiratory failure/ventilator dependent status post tracheostomy, hyperlipidemia and quadriplegia status post fall/C-spine surgery, recent treatment for MRSA pneumonia, who was admitted 07/27/15 secondary to repeated desaturations while on ventilator at [place]. Chest x-ray was concerning for persistent pneumonia. Oxygen saturations improved after admission.  Procedure/Significant Events: 11/09 - Foley change 11/12 - CTA Chest - No PE -Moderate right pleural effusion with peripheral rind of enhancement./Right lower lobe consolidation - Enlarged right hilar & mediastinal lymph nodes.  11/13 - Admit to Hospital from [facility] 11/13 - venous duplex - extensive L LE DVT 11/15 - right pleural fluid - negative cultures  Assessment/Plan:   Acute on chronic hypoxic respiratory failure w/ chronic hypercarbic respiratory failure Ventilator management per PCCM - appears stable at this time - attempts are being made to wen the pt from the vent   Sepsis secondary to MRSA HCAP  recently treated with Vancomycin for MRSA pneumonia - tracheal aspirate revealed persistent MRSA - Vancomycin continues  Complicated Proteus / Pseudomonas UTI Urine cultures grew Proteus and Pseudomonas, but Pseudomonas at a lower colony count most c/w colonization -  given 1 dose of fosfomycin 08/02/15 to cover Pseudomonas in the urine per Dr. Rockne Menghini - Rocephin stopped after 2nd dose - recheck UA / culture   1/2 blood cx positive coagulase negative staph  Suspicious for contamination - nonetheless will be appropriately covered with antibiotic being used for MRSA pneumonia    Diarrhea - Flexiseal in place with watery stools - C. difficile negative 08/03/15  Dysphagia - PEG dependent  - Being followed by speech therapy and dietitian - Ice chips with supervision only - continue tube feeds per dietitian recommendations. - Repeat FEES this week (Mon or Tues) for ?initiation of food/beverage, per ST  Lymphadenopathy - Status post thoracentesis 07/30/15 for further evaluation - nondiagnostic - Dr. Ashok Cordia recommends waiting for resolution of MRSA pneumonia to determine if/when BAL required for definitive diagnosis  Right Pleural Effusion - Trapped lung versus parapneumonic effusion - Status post thoracentesis per IR 07/30/15; nondiagnostic - exudative - culture negative. - PCCM to consider TCTS evaluation  Trach dependent  - Previously doing 12hr T bar trials - vent and trach care per PCCM - Continue to attempt to wean on CPAP/PS. - Status post Shiley #6 Cuffed Prox XLT Trach (from outside) placement  COPD - No evidence of acute exacerbation this time  Chronic hypotension - Treated with Midodrine 5 mg BID  L LE DVT (groin to knee) - Noted on duplex - unclear chronicity - continue IV heparin until INR therapeutic 48 hours. Coumadin started 08/03/15. - Rationale for coumadin discussed with the patient's wife (previously on coumadin for afib, taken off by PCP for unclear reasons).  Atrial fibrillation - rate controlled - now on heparin / warfarin for CVA prophy  Acute on chronic diastolic CHF - 2 D Echo [id number] EF 0000000, diastolic function could not be ascertained. No regional wall motion abnormalities.  H/O Ventricular tachycardia - Monitoring on telemetry  Dyslipidemia  Hypokalemia - continue to supplement potassium as needed and follow  Hypomagnesemia  - Corrected with supplementation  Chronic foley due to Neurogenic bladder - Foley changed 07/24/15 per outside records - changed  again 07/28/15 due to clogging  Anemia - normocytic -  No evidence of significant acute blood loss - follow   DM2 controlled - CBGs currently well controlled - A1c 6.1  Quadriplegia  - Turn and position every 2 hours. On air mattress.  Sacral decub - Seen by WOC who notes moisture associated skin damage but no breakdown   Obesity - Body mass index is 37.5 kg/(m^2).   MRSA screen +  DVT prophylaxis - IV heparin >  warfarin  Family Communication/Anticipated D/C date and plan/Code Status   Family Communication: Wife at the bedside Disposition Plan: From [facility] - plan for return to [facility], ?later this week  Code Status:     Code Status Orders        Start     Ordered   07/28/15 0125  Full code   Continuous     07/28/15 0126     IV Access:    PICC 08/01/15  Procedures and diagnostic studies:   Ct Angio Chest Pe W/cm &/or Wo Cm  07/27/2015  CLINICAL DATA:  Acute onset of severe hypoxia.  Initial encounter. EXAM: CT ANGIOGRAPHY CHEST WITH CONTRAST TECHNIQUE: Multidetector CT imaging of the chest was performed using the standard protocol during bolus administration of intravenous contrast. Multiplanar CT image reconstructions and MIPs were obtained to evaluate the vascular anatomy. CONTRAST:  137mL OMNIPAQUE IOHEXOL 350 MG/ML SOLN COMPARISON:  Chest radiograph performed earlier today at 4:35 p.m. FINDINGS: There is no evidence of pulmonary embolus. Evaluation for pulmonary embolus is suboptimal in areas of airspace consolidation. There is a complex small to moderate right-sided pleural effusion, with a peripheral rind of enhancement, raising concern for evolving empyema. Underlying dense partial consolidation of the right lower lobe is noted. There is partial opacification of the bronchus to the right lower lobe. This is suspicious for aspiration pneumonia. Mildly prominent right hilar nodes are seen, measuring up to 1.1 cm in short axis, and a 1.4 cm periaortic node is seen. A 1.9 cm right paratracheal node is noted. Underlying  right infrahilar mass cannot be excluded. Minimal left-sided atelectasis is noted.  No pneumothorax is seen. A left-sided chest pacemaker, with a single lead ending at the right ventricle. Diffuse coronary artery calcifications are seen. Biatrial enlargement is noted. There is mild prominence of the proximal aortic arch, measuring up to 3.9 cm. A tracheostomy tube is noted. No axillary lymphadenopathy is seen. The visualized portions of the thyroid gland are unremarkable in appearance. The visualized portions of the liver and spleen are unremarkable. The visualized portions of the pancreas, gallbladder, stomach and adrenal glands are within normal limits. Left-sided perinephric stranding is noted. No acute osseous abnormalities are seen. Cervical fusion hardware is partially imaged. Anterior bridging osteophytes are noted along the thoracic spine. Review of the MIP images confirms the above findings. IMPRESSION: 1. No evidence of pulmonary embolus. 2. Complex small to moderate right-sided pleural effusion, with a peripheral rind of enhancement, concerning for evolving empyema. Underlying dense partial consolidation of the right lower lung lobe, with partial opacification of the associated bronchus, suspicious for aspiration pneumonia. Diagnostic thoracentesis is suggested for further evaluation, given the complexity of the pleural effusion. This would also help to exclude underlying malignancy, given visualized lymphadenopathy. 3. Prominent right hilar and mediastinal nodes seen, measuring up to 1.9 cm in short axis. 4. Mild prominence of the proximal aortic arch, measuring up to 3.9 cm in diameter. 5. Diffuse coronary artery calcifications seen. Electronically Signed   By: Jacqulynn Cadet  Chang M.D.   On: 07/27/2015 22:54   Dg Chest Port 1 View  07/27/2015  CLINICAL DATA:  Respiratory distress, recently diagnosed with pneumonia. Desaturations. EXAM: PORTABLE CHEST 1 VIEW COMPARISON:  None. FINDINGS: Heart is enlarged.  Mediastinal diameter is also somewhat prominent but this is likely accentuated by the supine patient positioning. Left chest wall pacemaker/AICD in place. There is prominence of the right perihilar shadow, of uncertain etiology. Probable small right pleural effusion. Perhaps mild interstitial edema. Right-sided PICC line adequately positioned with tip in the upper SVC. Anterior cervical fusion hardware noted within the lower cervical spine. Old healed fracture of the left clavicle. No acute osseous abnormality seen. IMPRESSION: 1. Cardiomegaly. 2. Prominence of the right perihilar shadow may represent associated central pulmonary vascular congestion related to mild congestive heart failure/volume overload. Perihilar mass or pneumonia cannot be excluded on this single exam. 3. Slight prominence of the mediastinal diameter likely accentuated by the supine patient positioning. If any chest pain, would recommend chest CT angiogram for more definitive characterization. 4. Small right pleural effusion. 5. Probable mild bilateral interstitial edema. Electronically Signed   By: Franki Cabot M.D.   On: 07/27/2015 17:18    Medical Consultants:   PCCM  Anti-Infectives:   Zosyn 11/12 >> 11/16 Vanc 11/12 >> Fluconazole 11/13>> Ceftriaxone 11/17>>11/18 Fosfomycin x 1 11/18  Subjective:   The patient is much more alert and interactive today than he has been since I have seen him.  He denies any acute complaints.  He has been attempting weaning trials with respiratory therapy this morning but anxiety and tachypnea have interrupted days.  He denies chest pain nausea vomiting or abdominal pain.  Objective:    Filed Vitals:   08/05/15 0758 08/05/15 0800 08/05/15 1021 08/05/15 1022  BP:    110/62  Pulse:  72 78 75  Temp:      TempSrc:      Resp:  26    Height:      Weight:      SpO2: 97% 99%      Intake/Output Summary (Last 24 hours) at 08/05/15 1132 Last data filed at 08/05/15 0900  Gross per 24  hour  Intake 1038.5 ml  Output   3275 ml  Net -2236.5 ml   Filed Weights   08/03/15 0500 08/04/15 0500 08/05/15 0423  Weight: 163.295 kg (360 lb) 163.295 kg (360 lb) 162.841 kg (359 lb)    Exam: General: No acute respiratory distress on vent via trach  Lungs: poor air movement B bases - no wheeze  Cardiovascular: Regular rate without murmur gallop or rub  Abdomen: Nontender, obese, soft, bowel sounds positive, no rebound, no ascites, no appreciable mass Extremities: No significant cyanosis, or clubbing;  1+ edema bilateral lower extremities   Data Reviewed:    Labs: Basic Metabolic Panel:  Recent Labs Lab 07/30/15 1245 08/01/15 0324 08/01/15 1320 08/02/15 1257 08/03/15 0530 08/04/15 0500 08/05/15 0430  NA  --  141  --  142 142 142 141  K 3.2* 2.8* 2.9* 3.1* 3.0* 3.5 3.6  CL  --  108  --  109 108 109 108  CO2  --  27  --  28 27 28 29   GLUCOSE  --  115*  --  131* 112* 112* 117*  BUN  --  15  --  19 19 16 19   CREATININE  --  0.45*  --  0.42* 0.40* 0.38* 0.39*  CALCIUM  --  8.1*  --  8.1* 8.2*  8.4* 8.5*  MG 1.6* 1.4* 2.0  --   --   --   --    Liver Function Tests:  Recent Labs Lab 07/30/15 0535  AST 19  ALT 14*  ALKPHOS 46  BILITOT 0.6  PROT 5.9*  ALBUMIN 1.8*   CBC:  Recent Labs Lab 08/01/15 0324 08/02/15 0515 08/03/15 0530 08/04/15 0500 08/05/15 0430  WBC 10.0 8.9 8.0 7.3 7.5  HGB 7.7* 7.4* 7.4* 7.6* 7.6*  HCT 26.8* 25.3* 26.0* 26.3* 26.0*  MCV 90.8 90.7 91.2 91.0 90.6  PLT 325 327 328 340 334   CBG:  Recent Labs Lab 08/04/15 1629 08/04/15 2052 08/05/15 08/05/15 0402 08/05/15 0736  GLUCAP 107* 114* 111* 112* 115*   Sepsis Labs:  Recent Labs Lab 07/30/15 0535  08/02/15 0515 08/03/15 0530 08/04/15 0500 08/05/15 0430  PROCALCITON 2.40  --   --   --   --   --   WBC 10.6*  < > 8.9 8.0 7.3 7.5  < > = values in this interval not displayed.   Microbiology Recent Results (from the past 240 hour(s))  Blood Culture (routine x 2)      Status: None   Collection Time: 07/27/15  4:30 PM  Result Value Ref Range Status   Specimen Description BLOOD LEFT HAND  Final   Special Requests BOTTLES DRAWN AEROBIC AND ANAEROBIC 5CC  Final   Culture NO GROWTH 5 DAYS  Final   Report Status 08/01/2015 FINAL  Final  Blood Culture (routine x 2)     Status: None   Collection Time: 07/27/15  4:50 PM  Result Value Ref Range Status   Specimen Description BLOOD LEFT HAND  Final   Special Requests IN PEDIATRIC BOTTLE 2CC  Final   Culture  Setup Time   Final    GRAM POSITIVE COCCI IN CLUSTERS AEROBIC BOTTLE ONLY CRITICAL RESULT CALLED TO, READ BACK BY AND VERIFIED WITH: P KEATON,RN AT [id number] 07/28/15 BY L BENFIELD    Culture   Final    STAPHYLOCOCCUS SPECIES (COAGULASE NEGATIVE) THE SIGNIFICANCE OF ISOLATING THIS ORGANISM FROM A SINGLE VENIPUNCTURE CANNOT BE PREDICTED WITHOUT FURTHER CLINICAL AND CULTURE CORRELATION. SUSCEPTIBILITIES AVAILABLE ONLY ON REQUEST.    Report Status 07/30/2015 FINAL  Final  Urine culture     Status: None   Collection Time: 07/27/15  4:50 PM  Result Value Ref Range Status   Specimen Description URINE, RANDOM  Final   Special Requests NONE  Final   Culture   Final    >=100,000 COLONIES/mL PROTEUS MIRABILIS 40,000 COLONIES/ml PSEUDOMONAS AERUGINOSA    Report Status 07/31/2015 FINAL  Final   Organism ID, Bacteria PROTEUS MIRABILIS  Final   Organism ID, Bacteria PSEUDOMONAS AERUGINOSA  Final      Susceptibility   Proteus mirabilis - MIC*    AMPICILLIN <=2 SENSITIVE Sensitive     CEFAZOLIN <=4 SENSITIVE Sensitive     CEFTRIAXONE <=1 SENSITIVE Sensitive     CIPROFLOXACIN <=0.25 SENSITIVE Sensitive     GENTAMICIN <=1 SENSITIVE Sensitive     IMIPENEM 8 INTERMEDIATE Intermediate     NITROFURANTOIN 128 RESISTANT Resistant     TRIMETH/SULFA <=20 SENSITIVE Sensitive     AMPICILLIN/SULBACTAM <=2 SENSITIVE Sensitive     PIP/TAZO <=4 SENSITIVE Sensitive     * >=100,000 COLONIES/mL PROTEUS MIRABILIS   Pseudomonas  aeruginosa - MIC*    CEFTAZIDIME 2 SENSITIVE Sensitive     CIPROFLOXACIN >=4 RESISTANT Resistant     GENTAMICIN >=16 RESISTANT Resistant  IMIPENEM >=16 RESISTANT Resistant     CEFEPIME 8 SENSITIVE Sensitive     * 40,000 COLONIES/ml PSEUDOMONAS AERUGINOSA  Culture, respiratory (NON-Expectorated)     Status: None   Collection Time: 07/27/15  5:28 PM  Result Value Ref Range Status   Specimen Description TRACHEAL ASPIRATE  Final   Special Requests NONE  Final   Gram Stain   Final    ABUNDANT WBC PRESENT,BOTH PMN AND MONONUCLEAR NO SQUAMOUS EPITHELIAL CELLS SEEN NO ORGANISMS SEEN Performed at [facility]    Culture   Final    MODERATE METHICILLIN RESISTANT STAPHYLOCOCCUS AUREUS Note: RIFAMPIN AND GENTAMICIN SHOULD NOT BE USED AS SINGLE DRUGS FOR TREATMENT OF STAPH INFECTIONS. This organism DOES NOT demonstrate inducible Clindamycin resistance in vitro. CRITICAL RESULT CALLED TO, READ BACK BY AND VERIFIED WITH: NICOLE WALLER AT  [id number] ON 07/30/15 BY WEBSP Performed at [facility]    Report Status 07/30/2015 FINAL  Final   Organism ID, Bacteria METHICILLIN RESISTANT STAPHYLOCOCCUS AUREUS  Final      Susceptibility   Methicillin resistant staphylococcus aureus - MIC*    CLINDAMYCIN <=0.25 SENSITIVE Sensitive     ERYTHROMYCIN >=8 RESISTANT Resistant     GENTAMICIN <=0.5 SENSITIVE Sensitive     LEVOFLOXACIN >=8 RESISTANT Resistant     OXACILLIN >=4 RESISTANT Resistant     RIFAMPIN <=0.5 SENSITIVE Sensitive     TRIMETH/SULFA >=320 RESISTANT Resistant     VANCOMYCIN 1 SENSITIVE Sensitive     TETRACYCLINE <=1 SENSITIVE Sensitive     * MODERATE METHICILLIN RESISTANT STAPHYLOCOCCUS AUREUS  MRSA PCR Screening     Status: Abnormal   Collection Time: 07/28/15  3:23 AM  Result Value Ref Range Status   MRSA by PCR POSITIVE (A) NEGATIVE Final    Comment:        The GeneXpert MRSA Assay (FDA approved for NASAL specimens only), is one component of a comprehensive MRSA  colonization surveillance program. It is not intended to diagnose MRSA infection nor to guide or monitor treatment for MRSA infections. RESULT CALLED TO, READ BACK BY AND VERIFIED WITH: SPOKE TO RB PERRIN,J AT 6:32 ON 07/28/2015 [email or web address]   Body fluid culture     Status: None   Collection Time: 07/30/15  3:40 PM  Result Value Ref Range Status   Specimen Description FLUID RIGHT PLEURAL  Final   Special Requests NONE  Final   Gram Stain   Final    WBC PRESENT, PREDOMINANTLY MONONUCLEAR NO ORGANISMS SEEN CYTOSPIN    Culture NO GROWTH 3 DAYS  Final   Report Status 08/03/2015 FINAL  Final  C difficile quick scan w PCR reflex     Status: None   Collection Time: 08/03/15  3:33 PM  Result Value Ref Range Status   C Diff antigen NEGATIVE NEGATIVE Final   C Diff toxin NEGATIVE NEGATIVE Final   C Diff interpretation Negative for toxigenic C. difficile  Final     Medications:   . antiseptic oral rinse  7 mL Mouth Rinse QID  . chlorhexidine gluconate  15 mL Mouth/Throat BID  . digoxin  0.125 mg Per Tube Daily  . escitalopram  20 mg Per Tube Daily  . feeding supplement (PRO-STAT SUGAR FREE 64)  60 mL Per Tube 5 X Daily  . feeding supplement (VITAL HIGH PROTEIN)  1,000 mL Per Tube Q24H  . fluconazole  200 mg Per Tube Daily  . folic acid  1 mg Per Tube Daily  . free water  200  mL Per Tube 4 times per day  . gabapentin  800 mg Per Tube BID  . insulin aspart  0-9 Units Subcutaneous 6 times per day  . ipratropium-albuterol  3 mL Nebulization QID  . metoprolol tartrate  12.5 mg Per Tube BID  . midodrine  5 mg Per Tube BID WC  . mirtazapine  15 mg Per Tube QHS  . nystatin   Topical TID  . pantoprazole sodium  40 mg Per Tube BID  . potassium chloride  40 mEq Per Tube BID  . vancomycin  1,250 mg Intravenous Q24H  . vitamin B-12  250 mcg Per Tube Daily  . warfarin  6 mg Oral ONCE-1800  . warfarin   Does not apply Once  . Warfarin - Pharmacist Dosing Inpatient   Does not apply q1800    Continuous Infusions: . sodium chloride 50 mL/hr at 08/03/15 1851  . heparin 1,850 Units/hr (08/05/15 0444)    Time spent: 35 minutes.  The patient is medically complex with multiple co-morbidities and is at high risk for clinical deterioration and requires high complexity decision making.    LOS: 8 days   Cherene Altes, MD [facility] Consults/Admissions - Flow Manager [phone] Office  [phone]  Contact MD directly via text page:      [email or web address]      password [facility]  08/05/2015, 11:32 AM

## 2015-08-05 NOTE — Progress Notes (Signed)
   08/05/15 1601  Vent Select  Invasive or Noninvasive Invasive  Adult Vent Y  Adult Ventilator Settings  Vent Type Servo i  Humidity HME  Vent Mode PSV  FiO2 (%) 30 %  I Time 0.8 Sec(s)  Pressure Support 15 cmH20  PEEP 5 cmH20  Adult Ventilator Measurements  Peak Airway Pressure 21 L/min  Mean Airway Pressure 11 cmH20  Resp Rate Spontaneous 21 br/min  Resp Rate Total 21 br/min  Spont TV 401 mL  Measured Ve 10.2 mL  HOB> 30 Degrees Y  Adult Ventilator Alarms  Alarms On Y  Ve High Alarm 18 L/min  Ve Low Alarm 4 L/min  Resp Rate High Alarm 38 br/min  Resp Rate Low Alarm 14  PEEP Low Alarm 3 cmH2O  Press High Alarm 50 cmH2O  Placed patient back on psv 15./5 per md request to wean as tolerated.  Patient tolerating well at this time.

## 2015-08-05 NOTE — Care Management Important Message (Signed)
Important Message  Patient Details  Name: Thomas Zuniga MRN: [id number] Date of Birth: 10-Oct-1940   Medicare Important Message Given:  Yes    Loni Delbridge T, RN 08/05/2015, 2:04 PM

## 2015-08-05 NOTE — Progress Notes (Signed)
ANTICOAGULATION CONSULT NOTE - Follow Up Consult  Pharmacy Consult for Heparin and Coumadin Indication: DVT  No Known Allergies  Patient Measurements: Height: 6\' 1"  (185.4 cm) Weight: (!) 359 lb (162.841 kg) IBW/kg (Calculated) : 79.9 Heparin Dosing Weight:   Vital Signs: Temp: 97.8 F (36.6 C) (11/21 0739) Temp Source: Axillary (11/21 0739) BP: 92/52 mmHg (11/21 0739) Pulse Rate: 72 (11/21 0800)  Labs:  Recent Labs  08/03/15 0530 08/03/15 1518 08/04/15 0500 08/05/15 0430  HGB 7.4*  --  7.6* 7.6*  HCT 26.0*  --  26.3* 26.0*  PLT 328  --  340 334  LABPROT  --  16.2* 15.9* 15.7*  INR  --  1.28 1.26 1.23  HEPARINUNFRC 0.40  --  0.43 0.38  CREATININE 0.40*  --  0.38* 0.39*    Estimated Creatinine Clearance: 133.5 mL/min (by C-G formula based on Cr of 0.39).   Medications:  Scheduled:  . antiseptic oral rinse  7 mL Mouth Rinse QID  . chlorhexidine gluconate  15 mL Mouth/Throat BID  . digoxin  0.125 mg Per Tube Daily  . escitalopram  20 mg Per Tube Daily  . feeding supplement (PRO-STAT SUGAR FREE 64)  60 mL Per Tube 5 X Daily  . feeding supplement (VITAL HIGH PROTEIN)  1,000 mL Per Tube Q24H  . fluconazole  200 mg Per Tube Daily  . folic acid  1 mg Per Tube Daily  . free water  200 mL Per Tube 4 times per day  . gabapentin  800 mg Per Tube BID  . insulin aspart  0-9 Units Subcutaneous 6 times per day  . ipratropium-albuterol  3 mL Nebulization QID  . metoprolol tartrate  12.5 mg Per Tube BID  . midodrine  5 mg Per Tube BID WC  . mirtazapine  15 mg Per Tube QHS  . nystatin   Topical TID  . pantoprazole sodium  40 mg Per Tube BID  . potassium chloride  40 mEq Per Tube BID  . vancomycin  1,250 mg Intravenous Q24H  . vitamin B-12  250 mcg Per Tube Daily  . warfarin   Does not apply Once  . Warfarin - Pharmacist Dosing Inpatient   Does not apply q1800    Assessment: 74yo male with LLE DVT.  Heparin level is therapeutic, INR unchanged after first 2 doses of  Coumadin.  Pt needs minimum 5 day overlap of Heparin & Coumadin, with INR therapeutic x 24hr.  No bleeding noted, recent hx GIB and monitoring closely.  Pt on fluconazole, which may increase Coumadin effect.  Goal of Therapy:  INR 2-3 Heparin level 0.3-0.7 units/ml Monitor platelets by anticoagulation protocol: Yes    Plan:  - Continue heparin gtt at 1850 units/hr - Daily HL and CBC - Warfarin 6mg  PO x 1 tonight - Daily INR  - Will attempt education with wife present, pt going to an LTAC likely d/t chronic trach - wife not in room, try back later  Gracy Bruins, [facility]

## 2015-08-05 NOTE — Progress Notes (Signed)
PULMONARY / CRITICAL CARE MEDICINE   Name: Thomas Zuniga MRN: [id number] DOB: June 30, 1941    ADMISSION DATE:  07/27/2015 CONSULTATION DATE:  07/28/2015  REFERRING MD :  EDP  CHIEF COMPLAINT:  HCAP  INITIAL PRESENTATION:  74 year old male with quadriplegia status post fall & C-spine surgery since 02/2015. Patient treated for recent MRSA pneumonia. He was having repeated desaturations while on ventilator sometimes with simple repositioning. Chest x-ray was concerning for persistent pneumonia & 11/13 patient was transferred to the emergency department from [place] for further evaluation. Previously the patient was tolerating T bar trials for 12 hours at a time. He has continued to have difficulty with swallowing and was undergoing speech therapy. The patient's records also note of a recent GI bleed.  STUDIES:  CTA Chest 07/27/15 (personally reviewed by me) - No PE. Moderate right pleural effusion with peripheral rind of enhancement. Right lower lobe consolidation. Enlarged right hilar & mediastinal lymph nodes. No pericardial effusion. VENOUS DUPLEX 07/28/15 - Extensive LLE DVT TTE 07/30/15 - LV normal in size with EF 55-60%. No wall motion abnormalities. Unable to assess diastolic function. LA severely dilated. RA normal in size. RV mildly dilated with normal systolic function. No aortic stenosis or regurgitation. Trivial mitral regurgitation without stenosis. No pulmonic stenosis. Mild tricuspid regurgitation. No pericardial effusion.   SIGNIFICANT EVENTS: 11/09 - Foley change 11/13 - [facility] - LUE PICC placed in IR  OUTSIDE LABS: 07/24/15 ABG (on PCV 28/5 0.4 22):  7.42/72/97  06/27/15 CBC: 6.7/8.1/24.8/223 BMP: 138/4.0/96/33/13/0.5/100/8.3 Magnesium: 1.8 LFT: 2.6/6.1/0.7/75/18/29  CULTURES: Right Pleural Fluid 11/15>>> BCx2 11/12>>>Coag Neg Staph 1/2 UC 11/12 - Proteus mirabilis > 100k & Pseudomonas aeruginosa 40k  Tracheal Asp 11/12 -  MRSA  ANTIBIOTICS: Ceftriaxone 11/16>>> Vancomycin 11/12>>> Diflucan via tube 11/2>>> Nystatin Powder 11/2>>>  Zosyn 11/12 - 11/16  Right PFA (07/30/15): Glucose - 36 LDH - 2372 Total Protein - <3.0 WBC - 7    SUBJECTIVE: denies pain Only tolerates high PS this am Good UO Afebrile Mod secretions +  REVIEW OF SYSTEMS: Unable to obtain accurate review of systems the patient remains on ventilator.  VITAL SIGNS: Temp:  [97.1 F (36.2 C)-98.5 F (36.9 C)] 98 F (36.7 C) (11/21 1154) Pulse Rate:  [69-88] 75 (11/21 1154) Resp:  [20-26] 23 (11/21 1154) BP: (92-110)/(52-67) 108/62 mmHg (11/21 1154) SpO2:  [94 %-100 %] 98 % (11/21 1154) FiO2 (%):  [30 %] 30 % (11/21 1154) Weight:  [162.841 kg (359 lb)] 162.841 kg (359 lb) (11/21 0423) HEMODYNAMICS:   VENTILATOR SETTINGS: Vent Mode:  [-] PRVC FiO2 (%):  [30 %] 30 % Set Rate:  [17 bmp-21 bmp] 17 bmp Vt Set:  [480 mL] 480 mL PEEP:  [5 cmH20] 5 cmH20 Pressure Support:  [15 cmH20-18 cmH20] 15 cmH20 Plateau Pressure:  [21 cmH20-28 cmH20] 27 cmH20 INTAKE / OUTPUT:  Intake/Output Summary (Last 24 hours) at 08/05/15 1212 Last data filed at 08/05/15 1100  Gross per 24 hour  Intake  908.5 ml  Output   4525 ml  Net -3616.5 ml   PHYSICAL EXAMINATION: General:  Eyes closed. Wife at bedside. No distress.  Integument: Warm and dry. No rash on exposed skin. Left upper extremity PICC line in place. Abdominal PEG tube in place. HEENT:  drymucus membranes. Tracheostomy in place. No scleral icterus. Cardiovascular:  Regular rate. Bilateral lower extremity edema persists. No appreciable JVD given body habitus.  Pulmonary:  Coarse breath sounds bilaterally on ventilator with slight improvement. Symmetric chest wall rise  on ventilator. Secretions now mostly clear to tan and only moderate. Abdomen: Soft. Normal bowel sounds. Nondistended.  LABS:  CBC  Recent Labs Lab 08/03/15 0530 08/04/15 0500 08/05/15 0430  WBC 8.0 7.3 7.5   HGB 7.4* 7.6* 7.6*  HCT 26.0* 26.3* 26.0*  PLT 328 340 334   Coag's  Recent Labs Lab 08/03/15 1518 08/04/15 0500 08/05/15 0430  INR 1.28 1.26 1.23   BMET  Recent Labs Lab 08/03/15 0530 08/04/15 0500 08/05/15 0430  NA 142 142 141  K 3.0* 3.5 3.6  CL 108 109 108  CO2 27 28 29   BUN 19 16 19   CREATININE 0.40* 0.38* 0.39*  GLUCOSE 112* 112* 117*   Electrolytes  Recent Labs Lab 07/30/15 1245 08/01/15 0324 08/01/15 1320  08/03/15 0530 08/04/15 0500 08/05/15 0430  CALCIUM  --  8.1*  --   < > 8.2* 8.4* 8.5*  MG 1.6* 1.4* 2.0  --   --   --   --   < > = values in this interval not displayed. Sepsis Markers  Recent Labs Lab 07/30/15 0535  PROCALCITON 2.40   ABG No results for input(s): PHART, PCO2ART, PO2ART in the last 168 hours. Liver Enzymes  Recent Labs Lab 07/30/15 0535  AST 19  ALT 14*  ALKPHOS 46  BILITOT 0.6  ALBUMIN 1.8*   Cardiac Enzymes No results for input(s): TROPONINI, PROBNP in the last 168 hours. Glucose  Recent Labs Lab 08/04/15 1254 08/04/15 1629 08/04/15 2052 08/05/15 08/05/15 0402 08/05/15 0736  GLUCAP 122* 107* 114* 111* 112* 115*    Imaging No results found.    ASSESSMENT / PLAN:  74 year old male with acute on chronic hypoxic respiratory failure status post prior tracheostomy & PEG tube placement. Patient has a right pleural effusion that underwent thoracentesis by interventional radiology showing exudate by LDH only.   Exudative right pleural effusion is likely parapneumonic given the elevation of LDH.  1. Acute on chronic hypoxic respiratory failure: Status post Shiley #6 Cuffed Prox XLT Trach (from outside) placement Continuing treatment of underlying MRSA pneumonia. Continuing to nebs every 6 hours for airway clearance.Ct pressure support weaning efforts 2. MRSA pneumonia: Continuing on treatment with Vancomycin x 14 ds total  3. Chronic hypercarbic respiratory failure: Continuing Duonebs. No evidence for acute  decompensation. 4. Exudative right pleural effusion:  culture neg 5. Left lower extremity DVT: on heparin drip , can transition to lovenox or eliquis 6. Anemia: No signs of active bleeding. Hemoglobin stable. 7. Proteus & Pseudomonas UTI: Currently on Rocephin until 11/21 - 10 ds total  Summary - vent dependent due to extreme deconditioning & infections. Consider vent-SNF. The longest he has been off vent is 1 week since 02/2015   Kara Mead MD. [facility]. [place] [facility] Pager [phone] If no response call [phone]   08/05/2015, 12:12 PM

## 2015-08-06 DIAGNOSIS — I5033 Acute on chronic diastolic (congestive) heart failure: Secondary | ICD-10-CM | POA: Diagnosis present

## 2015-08-06 DIAGNOSIS — Z9911 Dependence on respirator [ventilator] status: Secondary | ICD-10-CM | POA: Diagnosis present

## 2015-08-06 DIAGNOSIS — I420 Dilated cardiomyopathy: Secondary | ICD-10-CM

## 2015-08-06 LAB — CBC
HCT: 26 % — ABNORMAL LOW (ref 39.0–52.0)
HEMATOCRIT: 26.2 % — AB (ref 39.0–52.0)
HEMOGLOBIN: 7.8 g/dL — AB (ref 13.0–17.0)
Hemoglobin: 7.9 g/dL — ABNORMAL LOW (ref 13.0–17.0)
MCH: 27.2 pg (ref 26.0–34.0)
MCH: 27.4 pg (ref 26.0–34.0)
MCHC: 30 g/dL (ref 30.0–36.0)
MCHC: 30.2 g/dL (ref 30.0–36.0)
MCV: 90.6 fL (ref 78.0–100.0)
MCV: 91 fL (ref 78.0–100.0)
PLATELETS: 306 10*3/uL (ref 150–400)
PLATELETS: 310 10*3/uL (ref 150–400)
RBC: 2.87 MIL/uL — ABNORMAL LOW (ref 4.22–5.81)
RBC: 2.88 MIL/uL — ABNORMAL LOW (ref 4.22–5.81)
RDW: 17.9 % — AB (ref 11.5–15.5)
RDW: 17.9 % — AB (ref 11.5–15.5)
WBC: 8.3 10*3/uL (ref 4.0–10.5)
WBC: 9.1 10*3/uL (ref 4.0–10.5)

## 2015-08-06 LAB — BASIC METABOLIC PANEL
Anion gap: 7 (ref 5–15)
BUN: 18 mg/dL (ref 6–20)
CALCIUM: 8.9 mg/dL (ref 8.9–10.3)
CO2: 29 mmol/L (ref 22–32)
CREATININE: 0.39 mg/dL — AB (ref 0.61–1.24)
Chloride: 105 mmol/L (ref 101–111)
Glucose, Bld: 109 mg/dL — ABNORMAL HIGH (ref 65–99)
Potassium: 3.8 mmol/L (ref 3.5–5.1)
SODIUM: 141 mmol/L (ref 135–145)

## 2015-08-06 LAB — PROTIME-INR
INR: 1.54 — AB (ref 0.00–1.49)
PROTHROMBIN TIME: 18.6 s — AB (ref 11.6–15.2)

## 2015-08-06 LAB — URINE CULTURE: CULTURE: NO GROWTH

## 2015-08-06 LAB — ABO/RH: ABO/RH(D): A NEG

## 2015-08-06 LAB — GLUCOSE, CAPILLARY
GLUCOSE-CAPILLARY: 114 mg/dL — AB (ref 65–99)
Glucose-Capillary: 104 mg/dL — ABNORMAL HIGH (ref 65–99)
Glucose-Capillary: 105 mg/dL — ABNORMAL HIGH (ref 65–99)
Glucose-Capillary: 128 mg/dL — ABNORMAL HIGH (ref 65–99)

## 2015-08-06 LAB — PREPARE RBC (CROSSMATCH)

## 2015-08-06 LAB — HEPARIN LEVEL (UNFRACTIONATED): HEPARIN UNFRACTIONATED: 0.34 [IU]/mL (ref 0.30–0.70)

## 2015-08-06 MED ORDER — WARFARIN SODIUM 5 MG PO TABS: ORAL_TABLET | ORAL | Status: AC

## 2015-08-06 MED ORDER — GERHARDT'S BUTT CREAM: 1.0000 "application " | TOPICAL_CREAM | CUTANEOUS | Status: AC | PRN

## 2015-08-06 MED ORDER — SODIUM CHLORIDE 0.9 % IV SOLN
Freq: Once | INTRAVENOUS | Status: AC
  Administered 2015-08-06: 10 mL/h via INTRAVENOUS

## 2015-08-06 MED ORDER — PANTOPRAZOLE SODIUM 40 MG PO PACK: 40.0000 mg | PACK | Freq: Two times a day (BID) | ORAL | Status: AC

## 2015-08-06 MED ORDER — IPRATROPIUM-ALBUTEROL 0.5-2.5 (3) MG/3ML IN SOLN: 3.0000 mL | Freq: Four times a day (QID) | RESPIRATORY_TRACT | Status: AC

## 2015-08-06 MED ORDER — FENTANYL CITRATE (PF) 100 MCG/2ML IJ SOLN: 100.0000 ug | INTRAMUSCULAR | Status: AC | PRN

## 2015-08-06 MED ORDER — CYANOCOBALAMIN 250 MCG PO TABS: 250.0000 ug | ORAL_TABLET | Freq: Every day | ORAL | Status: AC

## 2015-08-06 MED ORDER — ENOXAPARIN SODIUM 150 MG/ML ~~LOC~~ SOLN
165.0000 mg | Freq: Two times a day (BID) | SUBCUTANEOUS | Status: DC
  Administered 2015-08-06: 165 mg via SUBCUTANEOUS
  Filled 2015-08-06 (×2): qty 1.1

## 2015-08-06 MED ORDER — PRO-STAT SUGAR FREE PO LIQD: 60.0000 mL | Freq: Every day | ORAL | Status: AC

## 2015-08-06 MED ORDER — INSULIN ASPART 100 UNIT/ML ~~LOC~~ SOLN: 0.0000 [IU] | SUBCUTANEOUS | Status: AC

## 2015-08-06 MED ORDER — FREE WATER: 200.0000 mL | Freq: Four times a day (QID) | Status: AC

## 2015-08-06 MED ORDER — METOPROLOL TARTRATE 25 MG/10 ML ORAL SUSPENSION: 12.5000 mg | Freq: Two times a day (BID) | ORAL | Status: AC

## 2015-08-06 MED ORDER — WARFARIN SODIUM 5 MG PO TABS: 5.0000 mg | ORAL_TABLET | Freq: Once | ORAL | Status: DC

## 2015-08-06 MED ORDER — FERROUS SULFATE 300 (60 FE) MG/5ML PO SYRP: 300.0000 mg | ORAL_SOLUTION | Freq: Two times a day (BID) | ORAL | Status: AC

## 2015-08-06 MED ORDER — ENOXAPARIN SODIUM 150 MG/ML ~~LOC~~ SOLN: 165.0000 mg | Freq: Two times a day (BID) | SUBCUTANEOUS | Status: AC

## 2015-08-06 MED ORDER — MIDODRINE HCL 5 MG PO TABS: 5.0000 mg | ORAL_TABLET | Freq: Two times a day (BID) | ORAL | Status: AC

## 2015-08-06 MED ORDER — ONDANSETRON HCL 4 MG PO TABS: 4.0000 mg | ORAL_TABLET | Freq: Four times a day (QID) | ORAL | Status: AC | PRN

## 2015-08-06 MED ORDER — VITAL HIGH PROTEIN PO LIQD: 1000.0000 mL | ORAL | Status: AC

## 2015-08-06 MED ORDER — VANCOMYCIN HCL 10 G IV SOLR: 1250.0000 mg | INTRAVENOUS | Status: AC

## 2015-08-06 MED ORDER — GABAPENTIN 800 MG PO TABS: 800.0000 mg | ORAL_TABLET | Freq: Two times a day (BID) | ORAL | Status: AC

## 2015-08-06 NOTE — Progress Notes (Addendum)
ANTICOAGULATION CONSULT NOTE - Initial Consult  Pharmacy Consult for Lovenox and Coumadin Indication: atrial fibrillation and DVT  No Known Allergies  Patient Measurements: Height: 6\' 1"  (185.4 cm) Weight: (!) 364 lb (165.109 kg) IBW/kg (Calculated) : 79.9 Heparin Dosing Weight:   Vital Signs: Temp: 98.6 F (37 C) (11/22 0751) Temp Source: Axillary (11/22 0751) BP: 98/56 mmHg (11/22 0751) Pulse Rate: 94 (11/22 1005)  Labs:  Recent Labs  08/04/15 0500 08/05/15 0430 08/06/15 0515  HGB 7.6* 7.6* 7.8*  HCT 26.3* 26.0* 26.0*  PLT 340 334 306  LABPROT 15.9* 15.7* 18.6*  INR 1.26 1.23 1.54*  HEPARINUNFRC 0.43 0.38 0.34  CREATININE 0.38* 0.39* 0.39*    Estimated Creatinine Clearance: 134.6 mL/min (by C-G formula based on Cr of 0.39).   Medical History: Past Medical History  Diagnosis Date  . Quadriplegia ([place])   . PNA (pneumonia)     MRSA  . Diabetes mellitus without complication ([place])   . Atrial fibrillation ([place])   . COPD (chronic obstructive pulmonary disease) ([place])   . MRSA (methicillin resistant Staphylococcus aureus)   . Dyslipidemia   . Hypertension   . Depression   . Neurogenic bladder   . H/O ventricular tachycardia   . Respiratory failure, chronic (HCC)     Hypoxic & on Ventilator S/P Trach  . Rectal fistula   . Coronary artery disease     Diffuse coronary artery calcifications noted on CT scan 07/27/15    Medications:  Scheduled:  . antiseptic oral rinse  7 mL Mouth Rinse QID  . chlorhexidine gluconate  15 mL Mouth/Throat BID  . digoxin  0.125 mg Per Tube Daily  . escitalopram  20 mg Per Tube Daily  . feeding supplement (PRO-STAT SUGAR FREE 64)  60 mL Per Tube 5 X Daily  . feeding supplement (VITAL HIGH PROTEIN)  1,000 mL Per Tube Q24H  . fluconazole  200 mg Per Tube Daily  . folic acid  1 mg Per Tube Daily  . free water  200 mL Per Tube 4 times per day  . gabapentin  800 mg Per Tube BID  . insulin aspart  0-9 Units Subcutaneous 6 times per  day  . ipratropium-albuterol  3 mL Nebulization QID  . metoprolol tartrate  12.5 mg Per Tube BID  . midodrine  5 mg Per Tube BID WC  . mirtazapine  15 mg Per Tube QHS  . nystatin   Topical TID  . pantoprazole sodium  40 mg Per Tube BID  . potassium chloride  40 mEq Per Tube BID  . vancomycin  1,250 mg Intravenous Q24H  . vitamin B-12  250 mcg Per Tube Daily  . warfarin   Does not apply Once  . Warfarin - Pharmacist Dosing Inpatient   Does not apply q1800    Assessment: 74yo male with DVT, to transition from Heparin to Lovenox to complete bridge with Coumadin.  INR 1.54, Hg stable and pltc wnl.  No bleeding noted, but pt with recent GIB.  He is also on Fluconazole.  Cr 0.39, which does not adequately reflect renal fxn given quadriplegic status.  UOP 3ml/kg/hr.    Goal of Therapy:  INR 2-3 Anti-Xa level 0.6-1 units/ml 4hrs after LMWH dose given Monitor platelets by anticoagulation protocol: Yes   Plan:  Lovenox 165mg  SQ q12, to start 1hr after heparin d/c'd (discussed this with RN) D/C HLs Change CBC to q72 Coumadin 5mg  x 1 tonight Daily INR Check antiXa heparin level on 11/23  Gracy Bruins, PharmD Clinical Pharmacist [facility]

## 2015-08-06 NOTE — Progress Notes (Signed)
Speech Language Pathology Treatment: [name] Speaking valve  Patient Details Name: Thomas Zuniga MRN: [id number] DOB: Jul 18, 1941 Today's Date: 08/06/2015 Time: 1000-1056 SLP Time Calculation (min) (ACUTE ONLY): 56 min  Assessment / Plan / Recommendation Clinical Impression  Pt seen with RT while on CPAP/PS, no complications. Facilitated clearance of secretions orally with min verbal cues and application of suction. Pt tolerated PMSV for 40 minutes, communicating with wife, MD and participating in swallow eval with no complications. Time to consider opportunities for pt to wear PMSV in line without SLP present for more frequent function communication events and PO intake. Will discuss with team.    HPI HPI: 74 year old male with quadriplegia status post fall & C-spine surgery. Patient treated for recent MRSA pneumonia. He was having repeated desaturations while on ventilator sometimes with simple repositioning. Chest x-ray was concerning for persistent pneumonia with RLL consolidation.      SLP Plan  Continue with current plan of care     Recommendations         Patient may use Passy-Muir Speech Valve: with SLP only PMSV Supervision: Full       Plan: Continue with current plan of care   Thomas Zuniga, Thomas Zuniga 08/06/2015, 11:42 AM

## 2015-08-06 NOTE — Progress Notes (Signed)
Called report to [facility] to [place].

## 2015-08-06 NOTE — Procedures (Signed)
Objective Swallowing Evaluation: FEES-Fiberoptic Endoscopic Evaluation of Swallow  Patient Details  Name: Thomas Zuniga MRN: [id number] Date of Birth: [birth year]  Today's Date: 08/06/2015 Time: SLP Start Time (ACUTE ONLY): 1000-SLP Stop Time (ACUTE ONLY): 1045 SLP Time Calculation (min) (ACUTE ONLY): 45 min  Past Medical History:  Past Medical History  Diagnosis Date  . Quadriplegia ([place])   . PNA (pneumonia)     MRSA  . Diabetes mellitus without complication ([place])   . Atrial fibrillation ([place])   . COPD (chronic obstructive pulmonary disease) ([place])   . MRSA (methicillin resistant Staphylococcus aureus)   . Dyslipidemia   . Hypertension   . Depression   . Neurogenic bladder   . H/O ventricular tachycardia   . Respiratory failure, chronic (HCC)     Hypoxic & on Ventilator S/P Trach  . Rectal fistula   . Coronary artery disease     Diffuse coronary artery calcifications noted on CT scan 07/27/15   Past Surgical History:  Past Surgical History  Procedure Laterality Date  . Neck surgery    . Tracheostomy    . Peripherally inserted central catheter insertion    . Pacemaker placement     HPI: 74 year old male with quadriplegia status post fall & C-spine surgery. Patient treated for recent MRSA pneumonia. He was having repeated desaturations while on ventilator sometimes with simple repositioning. Chest x-ray was concerning for persistent pneumonia with RLL consolidation.  Subjective: pt alert, difficulty remembering what he has been doing with SLP at [place] / Plan / Recommendation  CHL IP CLINICAL IMPRESSIONS 08/06/2015  Therapy Diagnosis Mild oral phase dysphagia;Mild pharyngeal phase dysphagia  Clinical Impression Pt presents with mild oral and oropharyngeal impairment that are not outside of functional limits for age. FEES conducted while pt was on the ventilatlor set to CPAP/PS with inline PMSV in place and then again without PMSV to contrast function. With a  and without PMSV pt demosntrates trace oral residuals and slightly delayed swallow with no penetration or aspiration observed given strong, consistent airway protection. When the PMSV was not in place pt did have a slight gathering of secretions and trace oral residuals spilling to pharynx that were not sensed or cleared with a second swallow, leading to a gathering of stasis in the interarytenoid space, threatening penetration.   Overall the pt is safe to consume soft solids and thin liquids while wearing inline PMSV. Do not recommend PO intake when not using PMSV as pt has decreased sensation, increased standing secretions and inability to cough to protect airway. Since currently pt is only able to wear PMSV when SLP and RT are present, PO intake will be very limited. Will attempt to address this with team.       CHL IP TREATMENT RECOMMENDATION 08/06/2015  Treatment Recommendations Therapy as outlined in treatment plan below     CHL IP DIET RECOMMENDATION 08/06/2015  SLP Diet Recommendations Dysphagia 3 (Mech soft) solids;Thin liquid  Liquid Administration via Straw;Cup  Medication Administration Via alternative means  Compensations --  Postural Changes Remain semi-upright after after feeds/meals (Comment)     CHL IP OTHER RECOMMENDATIONS 08/06/2015  Recommended Consults --  Oral Care Recommendations Oral care QID  Other Recommendations Place PMSV during PO intake     CHL IP FOLLOW UP RECOMMENDATIONS 08/06/2015  Follow up Recommendations LTACH     CHL IP FREQUENCY AND DURATION 08/06/2015  Speech Therapy Frequency (ACUTE ONLY) min 2x/week  Treatment Duration 2 weeks  CHL IP ORAL PHASE 08/06/2015  Oral Phase Impaired  Oral - Pudding Teaspoon --  Oral - Pudding Cup --  Oral - Honey Teaspoon --  Oral - Honey Cup --  Oral - Nectar Teaspoon --  Oral - Nectar Cup --  Oral - Nectar Straw WFL  Oral - Thin Teaspoon --  Oral - Thin Cup Lingual/palatal residue  Oral - Thin  Straw Lingual/palatal residue  Oral - Puree WFL  Oral - Mech Soft WFL  Oral - Regular --  Oral - Multi-Consistency --  Oral - Pill --  Oral Phase - Comment --    CHL IP PHARYNGEAL PHASE 08/06/2015  Pharyngeal Phase Nectar;Thin;Solids  Pharyngeal- Pudding Teaspoon --  Pharyngeal --  Pharyngeal- Pudding Cup --  Pharyngeal --  Pharyngeal- Honey Teaspoon --  Pharyngeal --  Pharyngeal- Honey Cup --  Pharyngeal --  Pharyngeal- Nectar Teaspoon WFL  Pharyngeal --  Pharyngeal- Nectar Cup Delayed swallow initiation-vallecula  Pharyngeal --  Pharyngeal- Nectar Straw WFL  Pharyngeal --  Pharyngeal- Thin Teaspoon NT  Pharyngeal --  Pharyngeal- Thin Cup Delayed swallow initiation-vallecula  Pharyngeal --  Pharyngeal- Thin Straw Delayed swallow initiation-vallecula;Inter-arytenoid space residue  Pharyngeal --  Pharyngeal- Puree Delayed swallow initiation-vallecula  Pharyngeal --  Pharyngeal- Mechanical Soft Delayed swallow initiation-vallecula  Pharyngeal --  Pharyngeal- Regular NT  Pharyngeal --  Pharyngeal- Multi-consistency NT  Pharyngeal --  Pharyngeal- Pill NT  Pharyngeal --  Pharyngeal Comment --     CHL IP CERVICAL ESOPHAGEAL PHASE 08/06/2015  Cervical Esophageal Phase WFL  Pudding Teaspoon --  Pudding Cup --  Honey Teaspoon --  Honey Cup --  Nectar Teaspoon --  Nectar Cup --  Nectar Straw --  Thin Teaspoon --  Thin Cup --  Thin Straw --  Puree --  Mechanical Soft --  Regular --  Multi-consistency --  Pill --  Cervical Esophageal Comment --    Thomas [place], MA CCC-SLP [phone]  Thomas Zuniga 08/06/2015, 12:27 PM

## 2015-08-06 NOTE — Care Management Note (Signed)
Case Management Note  Patient Details  Name: Thomas Zuniga MRN: [id number] Date of Birth: 03/22/1941  Subjective/Objective:    Pt has been offered a bed @ [facility] and spouse agreeable to transfer.                 Expected Discharge Plan:  [place] (LTAC)  Discharge planning Services  CM Consult  Post Acute Care Choice:  Long Term Acute Care (LTAC)  Status of Service:  Completed, signed off  Medicare Important Message Given:  Yes  Girard Cooter, RN 08/06/2015, 3:10 PM

## 2015-08-06 NOTE — Progress Notes (Signed)
PULMONARY / CRITICAL CARE MEDICINE   Name: Thomas Zuniga MRN: [id number] DOB: [birth year]    ADMISSION DATE:  07/27/2015 CONSULTATION DATE:  07/28/2015  REFERRING MD :  EDP  CHIEF COMPLAINT:  HCAP  INITIAL PRESENTATION:  74 year old male with quadriplegia status post fall & C-spine surgery since 02/2015. Patient treated for recent MRSA pneumonia. He was having repeated desaturations while on ventilator sometimes with simple repositioning. Chest x-ray was concerning for persistent pneumonia & 11/13 patient was transferred to the emergency department from [place] for further evaluation. Previously the patient was tolerating T bar trials for 12 hours at a time. He has continued to have difficulty with swallowing and was undergoing speech therapy. The patient's records also note of a recent GI bleed.  STUDIES:  CTA Chest 07/27/15 (personally reviewed by me) - No PE. Moderate right pleural effusion with peripheral rind of enhancement. Right lower lobe consolidation. Enlarged right hilar & mediastinal lymph nodes. No pericardial effusion. VENOUS DUPLEX 07/28/15 - Extensive LLE DVT TTE 07/30/15 - LV normal in size with EF 55-60%. No wall motion abnormalities. Unable to assess diastolic function. LA severely dilated. RA normal in size. RV mildly dilated with normal systolic function. No aortic stenosis or regurgitation. Trivial mitral regurgitation without stenosis. No pulmonic stenosis. Mild tricuspid regurgitation. No pericardial effusion.   SIGNIFICANT EVENTS: 11/09 - Foley change 11/13 - Admit to Hospital from [place] 11/17 - LUE PICC placed in IR  OUTSIDE LABS: 07/24/15 ABG (on PCV 28/5 0.4 22):  7.42/72/97  06/27/15 CBC: 6.7/8.1/24.8/223 BMP: 138/4.0/96/33/13/0.5/100/8.3 Magnesium: 1.8 LFT: 2.6/6.1/0.7/75/18/29  CULTURES: Right Pleural Fluid 11/15>>> BCx2 11/12>>>Coag Neg Staph 1/2 UC 11/12 - Proteus mirabilis > 100k & Pseudomonas aeruginosa 40k  Tracheal Asp 11/12 -  MRSA  ANTIBIOTICS: Ceftriaxone 11/16>>> Vancomycin 11/12>>> Diflucan via tube 11/2>>> Nystatin Powder 11/2>>>  Zosyn 11/12 - 11/16  Right PFA (07/30/15): Glucose - 36 LDH - 2372 Total Protein - <3.0 WBC - 7    SUBJECTIVE: denies pain - tolerates high PS 15/5 this am Good UO Afebrile Mod secretions +  REVIEW OF SYSTEMS: denies pain, dyspnea  VITAL SIGNS: Temp:  [98 F (36.7 C)-98.7 F (37.1 C)] 98.6 F (37 C) (11/22 0751) Pulse Rate:  [75-84] 79 (11/22 0751) Resp:  [17-23] 17 (11/22 0751) BP: (95-110)/(56-69) 98/56 mmHg (11/22 0751) SpO2:  [96 %-100 %] 99 % (11/22 0751) FiO2 (%):  [30 %] 30 % (11/22 0820) Weight:  [364 lb (165.109 kg)] 364 lb (165.109 kg) (11/22 0423) HEMODYNAMICS:   VENTILATOR SETTINGS: Vent Mode:  [-] PSV;CPAP FiO2 (%):  [30 %] 30 % Set Rate:  [17 bmp] 17 bmp Vt Set:  [480 mL] 480 mL PEEP:  [5 cmH20] 5 cmH20 Pressure Support:  [15 cmH20] 15 cmH20 Plateau Pressure:  [26 cmH20-27 cmH20] 26 cmH20 INTAKE / OUTPUT:  Intake/Output Summary (Last 24 hours) at 08/06/15 0942 Last data filed at 08/06/15 0751  Gross per 24 hour  Intake 1164.3 ml  Output   4900 ml  Net -3735.7 ml   PHYSICAL EXAMINATION: General:  Awake, interactive,chr ill, Wife at bedside. No distress.  Integument: Warm and dry. No rash on exposed skin. Left upper extremity PICC line in place. Abdominal PEG tube in place. HEENT:  drymucus membranes. Tracheostomy in place. No scleral icterus. Cardiovascular:  Regular rate. Bilateral lower extremity edema persists. No appreciable JVD given body habitus.  Pulmonary:  Coarse breath sounds bilaterally on ventilator with slight improvement. Symmetric chest wall rise on ventilator. Secretions now mostly clear to tan and only  moderate. Abdomen: Soft. Normal bowel sounds. Nondistended. Neuro - quad  LABS:  CBC  Recent Labs Lab 08/04/15 0500 08/05/15 0430 08/06/15 0515  WBC 7.3 7.5 8.3  HGB 7.6* 7.6* 7.8*  HCT 26.3* 26.0* 26.0*   PLT 340 334 306   Coag's  Recent Labs Lab 08/04/15 0500 08/05/15 0430 08/06/15 0515  INR 1.26 1.23 1.54*   BMET  Recent Labs Lab 08/04/15 0500 08/05/15 0430 08/06/15 0515  NA 142 141 141  K 3.5 3.6 3.8  CL 109 108 105  CO2 28 29 29   BUN 16 19 18   CREATININE 0.38* 0.39* 0.39*  GLUCOSE 112* 117* 109*   Electrolytes  Recent Labs Lab 07/30/15 1245 08/01/15 0324 08/01/15 1320  08/04/15 0500 08/05/15 0430 08/06/15 0515  CALCIUM  --  8.1*  --   < > 8.4* 8.5* 8.9  MG 1.6* 1.4* 2.0  --   --   --   --   < > = values in this interval not displayed. Sepsis Markers No results for input(s): LATICACIDVEN, PROCALCITON, O2SATVEN in the last 168 hours. ABG No results for input(s): PHART, PCO2ART, PO2ART in the last 168 hours. Liver Enzymes No results for input(s): AST, ALT, ALKPHOS, BILITOT, ALBUMIN in the last 168 hours. Cardiac Enzymes No results for input(s): TROPONINI, PROBNP in the last 168 hours. Glucose  Recent Labs Lab 08/05/15 1148 08/05/15 1634 08/05/15 2018 08/06/15 0053 08/06/15 0418 08/06/15 0750  GLUCAP 128* 104* 106* 104* 105* 114*    Imaging No results found.    ASSESSMENT / PLAN:  74 year old male with acute on chronic hypoxic respiratory failure status post prior tracheostomy & PEG tube placement. Patient has a right pleural effusion that underwent thoracentesis by interventional radiology showing exudate by LDH only.   Exudative right pleural effusion is likely parapneumonic given the elevation of LDH.  1. Acute on chronic hypoxic respiratory failure: Status post Shiley #6 Cuffed Prox XLT Trach (from outside) Continuing to nebs every 6 hours for airway clearance.Ct pressure support weaning efforts -lower PS to 10/5 2. MRSA pneumonia: Continuing on treatment with Vancomycin x 14 ds total  - 11/25 3. Chronic hypercarbic respiratory failure: Continuing Duonebs. No evidence for acute decompensation. 4. Exudative right pleural effusion:  culture  neg 5. Left lower extremity DVT: on heparin drip , can transition to lovenox while awaiting therapeutic INR 6. Anemia: No signs of active bleeding. Hemoglobin stable. 7. Proteus & Pseudomonas UTI: Currently on Rocephin until 11/21 - 10 ds total  Summary - vent dependent due to extreme deconditioning & infections. Prefer vent-SNF to home dc. The longest he has been off vent is 1 week since 02/2015   Kara Mead MD. [facility]. [place] [facility] Pager [phone] If no response call [phone]   08/06/2015, 9:42 AM

## 2015-08-06 NOTE — Discharge Summary (Signed)
Physician Discharge Summary  Thomas Zuniga [id number] DOB: 05/20/1941 DOA: 07/27/2015  PCP: Verneita Griffes, MD  Admit date: 07/27/2015 Discharge date: 08/06/2015  Time spent: 45minutes  Recommendations for Outpatient Follow-up:   Acute on chronic hypoxic respiratory failure w/ chronic hypercarbic respiratory failure -Ventilator management per PCCM -Status post Shiley #6 Cuffed Prox XLT Trach (from outside) placement -Follow-up with Dr. Verneita Griffes in 3-4 weeks acute on chronic hypoxic respiratory failure, secondary to HCAP/UTI  Sepsis secondary to HCAP + UTI v/s other  -Recently treated for MRSA pneumonia -11/12 trach aspirate positive MRSA Continuing on treatment with Vancomycin Day complete 14 days of antibiotic per ID. -11/18 IR placed double-lumen midline PICC  Lymphadenopathy -Considering patient's pathologically Enlarged right hilar & mediastinal lymph nodes, will require thoracentesis for further evaluation  -IR thoracentesis; nondiagnostic -Have spoken with [facility] M Dr. Ashok Cordia and will wait until resolution of MRSA pneumonia to determine if/when BAL required for definitive diagnosis  Right Pleural Effusion -trapped lung versus parapneumonic effusion -IR thoracentesis; nondiagnostic see results below  - Follow-up Dr.Rakesh [facility] M in 6-8 weeks for MRSA HCAP, right pleural effusion (exudative/culture negative), lymphadenopathy mediastinal   Trach dependent/ventilator dependent  -Previously doing 12hr T bar trials - vent and trach care per PCCM  COPD -No evidence of acute exacerbation this time  Acute on chronic diastolic CHF/Cardiomyopathy - 2 D Echo 07/30/15 EF 0000000, diastolic function could not be ascertained. No regional wall motion abnormalities. -Transfuse for hemoglobin<8 -11/22 transfuse 1 unit PRBC  Chronic hypotension -Continue Midodrine 5 mg BID  UTI (positive Proteus Mirabilis/Pseudomonas Aeruginosa) -Antibiotics switched to ceftriaxone,  Zosyn discontinued  -Chronic foley - given 1 dose of Fosfomycin 08/02/15 to cover Pseudomonas in the urine per Dr. Rockne Menghini - Rocephin stopped after 2nd dose  - recheck UA / culture; NGTD   1/2 blood cx positive coag negative staph  -Most likely contamination  L LE DVT (groin to knee) -Noted on duplex  -Lovenox--> Coumadin(still subtherapeutic)  H/O Atrial fibrillation Normal sinus rhythm presently  H/O Ventricular tachycardia  Dyslipidemia  Hypokalemia -Continue potassium 40 mEq  BID   Chronic foley due to Neurogenic bladder -Foley changed 11/9 per outside records - changed again 11/13 due to clogging   Chronic Dysphagia - PEG dependent  -SLP evaluation pending - continue to utilize PEG for meds and feeds at this time  Anemia - normocytic -No evidence of significant acute blood loss - follow trend  DM2 controlled -CBG well controlled at this time -11/16 Hemoglobin A1c= 6.1  Quadriplegia   Sacral decub WOC to see      Discharge Diagnoses:  Principal Problem:   Sepsis due to methicillin resistant Staphylococcus aureus (MRSA) ([name]) Active Problems:   Acute on chronic renal failure (HCC)   Edema   S/P thoracentesis   HCAP (healthcare-associated pneumonia)   Sepsis due to urinary tract infection ([name])   Lymphadenopathy   Tracheostomy dependent (HCC)   Neurogenic bladder   Chronic indwelling Foley catheter   Quadriplegia ([place])   Decubitus ulcer of sacral area   Acute on chronic respiratory failure with hypoxia (HCC)   Acute on chronic respiratory failure with hypercapnia (HCC)   Morbid obesity ([name])   Ventilator dependent ([name])   Acute on chronic diastolic CHF (congestive heart failure), NYHA class 1 ([name])   Congestive dilated cardiomyopathy ([name])   Discharge Condition: Stable  Diet recommendation: NPO  Filed Weights   08/04/15 0500 08/05/15 0423 08/06/15 0423  Weight: 163.295 kg (360 lb) 162.841 kg (359 lb) 165.109  kg (364 lb)    History of  present illness:  74 year old WM male PMHx Depression, DM Type 2 without complication, Atrial fib, HTN, Ventricular Tachycardia, S/P Pacemaker COPD, Chronic Respiratory Failure, Vent Dependent, S/P tracheostomy HLD, with Quadriplegia S/P S/P fall & C-spine surgery.   Patient treated for recent MRSA pneumonia. He was having repeated desaturations while on ventilator at [facility], sometimes with simple repositioning. Chest x-ray was concerning for persistent pneumonia & patient was transferred to the ED at [facility] for further evaluation. During his hospitalization patient is been treated for sepsis secondary to HCAP/UTI. With appropriate antibiotics patient has responded well and has had significant improvement. In addition patient had thoracentesis of her right pleural effusion which was exudative by criteria, however cultures have been negative to date. Patient is being followed by pulmonology which feels that further aggressive treatment is not warranted at this time. After speaking with [name], patient should have lymphadenopathy and right pleural effusion evaluated again in 6-8 weeks. In addition patient was found to have acute/subacute DVT left lower extremity which is currently being treated with Lovenox----> Coumadin.   Consultants: Dr.Wesam Kathryne Sharper PCCM    Procedure/Significant Events: 11/09 - Foley change 11/12 - CTA Chest - No PE.  -Moderate right pleural effusion with peripheral rind of enhancement./Right lower lobe consolidation.  -Enlarged right hilar & mediastinal lymph nodes.  11/13 - Admit to Hospital from [facility] 11/13 - venous duplex - extensive L LE DVT 11/15 right pleural fluid NGTD 11/18 IR placed double-lumen midline PICC    Culture 11/12 blood left hand NGTD 11/12 blood left hand positive coag negative staph 11/12 urine positive Proteus Mirabilis/Pseudomonas aeruginosa 11/12 trach aspirate positive MRSA 11/21 urine NGTD   11/15 Pathology on right  pleural fluid;NON-DIAGNOSTIC ACELLULAR DEBRIS   Antibiotics: Zosyn 11/12 >> 11/16 Vanc 11/12 >> Fluconazole 11/13>> Ceftriaxone 11/17>> 11/18 Fosfomycin 11/18 1 dose   Discharge Exam: Filed Vitals:   08/06/15 1339 08/06/15 1345 08/06/15 1400 08/06/15 1445  BP: 100/74 100/74    Pulse:      Temp: 98.6 F (37 C) 98.6 F (37 C) 98.4 F (36.9 C) 99 F (37.2 C)  TempSrc: Oral Oral Oral Oral  Resp:      Height:      Weight:      SpO2:        General: alert follows commands , able to communicate well with Passy-Muir valve in place, NAD, chronic respiratory distress Eyes: Negative headache,negative scleral hemorrhage ENT: Negative Runny nose, negative gingival bleeding, Neck: Negative scars, masses, torticollis, lymphadenopathy, JVD, trach in place, no signs of infection Lungs: coarse breath sounds , negative wheezes or crackles, continued thick yellow secretions suctioned, but improved from last week Cardiovascular: Regular rate and rhythm without murmur gallop or rub normal S1 and S2 Abdomen: Morbidly obese, nondistended, positive soft, bowel sounds, no rebound, no ascites, no appreciable mass, PEG tube present negative sign of infection Extremities: No significant cyanosis, clubbing, or edema bilateral lower extremities Psychiatric: Negative depression, negative anxiety, negative fatigue, negative mania Neurologic: Cranial nerves II through XII intact, negative receptive aphasia.   Discharge Instructions     Medication List    STOP taking these medications        ceFEPIme 1 g in dextrose 5 % 50 mL     chlorhexidine 0.12 % solution  Commonly known as:  PERIDEX     clotrimazole-betamethasone cream  Commonly known as:  LOTRISONE     lidocaine 2 % solution  Commonly  known as:  XYLOCAINE     metoprolol tartrate 25 MG tablet  Commonly known as:  LOPRESSOR  Replaced by:  metoprolol tartrate 25 mg/10 mL Susp     pantoprazole 40 MG tablet  Commonly known as:   PROTONIX  Replaced by:  pantoprazole sodium 40 mg/20 mL Pack     PROBIOTIC FORMULA Caps     tamsulosin 0.4 MG Caps capsule  Commonly known as:  FLOMAX     torsemide 10 MG tablet  Commonly known as:  DEMADEX     vancomycin 1,000 mg in sodium chloride 0.9 % 250 mL  Replaced by:  vancomycin 1,250 mg in sodium chloride 0.9 % 250 mL      TAKE these medications        acetaminophen 325 MG tablet  Commonly known as:  TYLENOL  Take 650 mg by mouth every 6 (six) hours as needed for moderate pain.     digoxin 0.125 MG tablet  Commonly known as:  LANOXIN  Give 0.125 mg by tube daily.     enoxaparin 150 MG/ML injection  Commonly known as:  LOVENOX  Inject 1.1 mLs (165 mg total) into the skin every 12 (twelve) hours.     escitalopram 20 MG tablet  Commonly known as:  LEXAPRO  Take 20 mg by mouth daily.     feeding supplement (PRO-STAT SUGAR FREE 64) Liqd  Place 60 mLs into feeding tube 5 (five) times daily.     feeding supplement (VITAL HIGH PROTEIN) Liqd liquid  Place 1,000 mLs into feeding tube daily.     fentaNYL 100 MCG/2ML injection  Commonly known as:  SUBLIMAZE  Inject 2 mLs (100 mcg total) into the vein every 2 (two) hours as needed for severe pain (or RASS > 0).     ferrous sulfate 300 (60 FE) MG/5ML syrup  Place 5 mLs (300 mg total) into feeding tube 2 (two) times daily with a meal.     fluconazole 200 MG tablet  Commonly known as:  DIFLUCAN  Give 200 mg by tube daily.     folic acid 1 MG tablet  Commonly known as:  FOLVITE  Give 1 mg by tube daily.     free water Soln  Place 200 mLs into feeding tube every 6 (six) hours.     gabapentin 800 MG tablet  Commonly known as:  NEURONTIN  Place 1 tablet (800 mg total) into feeding tube 2 (two) times daily.     Gerhardt's butt cream Crea  Apply 1 application topically as needed for irritation.     insulin aspart 100 UNIT/ML injection  Commonly known as:  novoLOG  Inject 0-9 Units into the skin every 4 (four)  hours.     ipratropium-albuterol 0.5-2.5 (3) MG/3ML Soln  Commonly known as:  DUONEB  Take 3 mLs by nebulization every 4 (four) hours as needed (for SOB).     ipratropium-albuterol 0.5-2.5 (3) MG/3ML Soln  Commonly known as:  DUONEB  Take 3 mLs by nebulization 4 (four) times daily.     metoprolol tartrate 25 mg/10 mL Susp  Commonly known as:  LOPRESSOR  Place 5 mLs (12.5 mg total) into feeding tube 2 (two) times daily.     midodrine 5 MG tablet  Commonly known as:  PROAMATINE  Place 1 tablet (5 mg total) into feeding tube 2 (two) times daily with a meal.     mirtazapine 15 MG tablet  Commonly known as:  REMERON  Give 15 mg  by tube at bedtime.     multivitamin with minerals Tabs tablet  Give 1 tablet by tube daily.     ondansetron 4 MG tablet  Commonly known as:  ZOFRAN  Place 1 tablet (4 mg total) into feeding tube every 6 (six) hours as needed for nausea or vomiting.     pantoprazole sodium 40 mg/20 mL Pack  Commonly known as:  PROTONIX  Place 20 mLs (40 mg total) into feeding tube 2 (two) times daily.     vancomycin 1,250 mg in sodium chloride 0.9 % 250 mL  Inject 1,250 mg into the vein daily.     vitamin B-12 250 MCG tablet  Commonly known as:  CYANOCOBALAMIN  Place 1 tablet (250 mcg total) into feeding tube daily.     warfarin 5 MG tablet  Commonly known as:  COUMADIN  1 tab by mouth daily       No Known Allergies Follow-up Information    Follow up with Rigoberto Noel., MD In 6 weeks.   Specialty:  Pulmonary Disease   Why:  Estill Bakes [facility] M in 6-8 weeks for MRSA HCAP, right pleural effusion (exudative/culture negative), lymphadenopathy mediastinal   Contact information:   [address] [place] [address] [phone]       Follow up In 3 weeks.   Why:  Follow-up with Dr. Verneita Griffes in 3-4 weeks acute on chronic hypoxic respiratory failure, secondary to HCAP/UTI      Follow up with Verneita Griffes, MD In 3 weeks.   Specialty:  Internal Medicine    Why:  Follow-up with Dr. Verneita Griffes in 3-4 weeks acute on chronic hypoxic respiratory failure, secondary to HCAP/UTI   Contact information:   [address] [place] [address] [phone]        The results of significant diagnostics from this hospitalization (including imaging, microbiology, ancillary and laboratory) are listed below for reference.    Significant Diagnostic Studies: Ct Angio Chest Pe W/cm &/or Wo Cm  07/27/2015  CLINICAL DATA:  Acute onset of severe hypoxia.  Initial encounter. EXAM: CT ANGIOGRAPHY CHEST WITH CONTRAST TECHNIQUE: Multidetector CT imaging of the chest was performed using the standard protocol during bolus administration of intravenous contrast. Multiplanar CT image reconstructions and MIPs were obtained to evaluate the vascular anatomy. CONTRAST:  139mL OMNIPAQUE IOHEXOL 350 MG/ML SOLN COMPARISON:  Chest radiograph performed earlier today at 4:35 p.m. FINDINGS: There is no evidence of pulmonary embolus. Evaluation for pulmonary embolus is suboptimal in areas of airspace consolidation. There is a complex small to moderate right-sided pleural effusion, with a peripheral rind of enhancement, raising concern for evolving empyema. Underlying dense partial consolidation of the right lower lobe is noted. There is partial opacification of the bronchus to the right lower lobe. This is suspicious for aspiration pneumonia. Mildly prominent right hilar nodes are seen, measuring up to 1.1 cm in short axis, and a 1.4 cm periaortic node is seen. A 1.9 cm right paratracheal node is noted. Underlying right infrahilar mass cannot be excluded. Minimal left-sided atelectasis is noted.  No pneumothorax is seen. A left-sided chest pacemaker, with a single lead ending at the right ventricle. Diffuse coronary artery calcifications are seen. Biatrial enlargement is noted. There is mild prominence of the proximal aortic arch, measuring up to 3.9 cm. A tracheostomy tube is noted. No  axillary lymphadenopathy is seen. The visualized portions of the thyroid gland are unremarkable in appearance. The visualized portions of the liver and spleen are unremarkable. The  visualized portions of the pancreas, gallbladder, stomach and adrenal glands are within normal limits. Left-sided perinephric stranding is noted. No acute osseous abnormalities are seen. Cervical fusion hardware is partially imaged. Anterior bridging osteophytes are noted along the thoracic spine. Review of the MIP images confirms the above findings. IMPRESSION: 1. No evidence of pulmonary embolus. 2. Complex small to moderate right-sided pleural effusion, with a peripheral rind of enhancement, concerning for evolving empyema. Underlying dense partial consolidation of the right lower lung lobe, with partial opacification of the associated bronchus, suspicious for aspiration pneumonia. Diagnostic thoracentesis is suggested for further evaluation, given the complexity of the pleural effusion. This would also help to exclude underlying malignancy, given visualized lymphadenopathy. 3. Prominent right hilar and mediastinal nodes seen, measuring up to 1.9 cm in short axis. 4. Mild prominence of the proximal aortic arch, measuring up to 3.9 cm in diameter. 5. Diffuse coronary artery calcifications seen. Electronically Signed   By: Garald Balding M.D.   On: 07/27/2015 22:54   Ir Fluoro Guide Cv Line Left  08/01/2015  CLINICAL DATA:  Heart failure, tracheostomy, ventilatory support, poor peripheral access EXAM: POWER PICC LINE PLACEMENT WITH ULTRASOUND AND FLUOROSCOPIC GUIDANCE FLUOROSCOPY TIME:  24 seconds PROCEDURE: The patient was advised of the possible risks andcomplications and agreed to undergo the procedure. The patient was then brought to the angiographic suite for the procedure. The right arm was prepped with chlorhexidine, drapedin the usual sterile fashion using maximum barrier technique (cap and mask, sterile gown, sterile gloves,  large sterile sheet, hand hygiene and cutaneous antisepsis) and infiltrated locally with 1% Lidocaine. Ultrasound demonstrated patency of the right basilic vein, and this was documented with an image. Under real-time ultrasound guidance, this vein was accessed with a 21 gauge micropuncture needle and image documentation was performed. A 0.018 wire was introduced in to the vein. Over this, a 5 [place] double lumen power PICC was advanced to the lower SVC/right atrial junction. Fluoroscopy during the procedure and fluoro spot radiograph confirms appropriate catheter position. The catheter was flushed and covered with asterile dressing. Catheter length: 54 Complications: None immediate IMPRESSION: Successful right arm power PICC line placement with ultrasound and fluoroscopic guidance. The catheter is ready for use. Electronically Signed   By: Jerilynn Mages.  Shick M.D.   On: 08/01/2015 16:59   Ir US Guide Vasc Access Left  08/01/2015  CLINICAL DATA:  Heart failure, tracheostomy, ventilatory support, poor peripheral access EXAM: POWER PICC LINE PLACEMENT WITH ULTRASOUND AND FLUOROSCOPIC GUIDANCE FLUOROSCOPY TIME:  24 seconds PROCEDURE: The patient was advised of the possible risks andcomplications and agreed to undergo the procedure. The patient was then brought to the angiographic suite for the procedure. The right arm was prepped with chlorhexidine, drapedin the usual sterile fashion using maximum barrier technique (cap and mask, sterile gown, sterile gloves, large sterile sheet, hand hygiene and cutaneous antisepsis) and infiltrated locally with 1% Lidocaine. Ultrasound demonstrated patency of the right basilic vein, and this was documented with an image. Under real-time ultrasound guidance, this vein was accessed with a 21 gauge micropuncture needle and image documentation was performed. A 0.018 wire was introduced in to the vein. Over this, a 5 [place] double lumen power PICC was advanced to the lower SVC/right atrial  junction. Fluoroscopy during the procedure and fluoro spot radiograph confirms appropriate catheter position. The catheter was flushed and covered with asterile dressing. Catheter length: 54 Complications: None immediate IMPRESSION: Successful right arm power PICC line placement with ultrasound and fluoroscopic guidance. The catheter is  ready for use. Electronically Signed   By: Jerilynn Mages.  Shick M.D.   On: 08/01/2015 16:59   Dg Chest Port 1 View  07/30/2015  CLINICAL DATA:  Post right-sided thoracentesis EXAM: PORTABLE CHEST 1 VIEW COMPARISON:  07/27/2015; chest CT - 07/27/2015 FINDINGS: Grossly unchanged enlarged cardiac silhouette and mediastinal contours. Interval removal of right upper extremity approach PICC line. Otherwise, stable position of support apparatus. Grossly unchanged small partially loculated right-sided effusion post thoracentesis. No pneumothorax. Right basilar heterogeneous/consolidative opacities are unchanged. Mild pulmonary congestion without frank evidence of edema. Unchanged bones including lower cervical ACDF and paraspinal fusion, incompletely evaluated. IMPRESSION: 1. No evidence of pneumothorax following right-sided thoracentesis. 2. Grossly unchanged small partially loculated right-sided effusion with associated right medial basilar heterogeneous/consolidative opacities again worrisome for infection. 3. Pulmonary venous congestion without frank evidence of edema. 4. Interval removal of right upper extremity approach PICC line. Electronically Signed   By: Sandi Mariscal M.D.   On: 07/30/2015 15:58   Dg Chest Port 1 View  07/27/2015  CLINICAL DATA:  Respiratory distress, recently diagnosed with pneumonia. Desaturations. EXAM: PORTABLE CHEST 1 VIEW COMPARISON:  None. FINDINGS: Heart is enlarged. Mediastinal diameter is also somewhat prominent but this is likely accentuated by the supine patient positioning. Left chest wall pacemaker/AICD in place. There is prominence of the right perihilar  shadow, of uncertain etiology. Probable small right pleural effusion. Perhaps mild interstitial edema. Right-sided PICC line adequately positioned with tip in the upper SVC. Anterior cervical fusion hardware noted within the lower cervical spine. Old healed fracture of the left clavicle. No acute osseous abnormality seen. IMPRESSION: 1. Cardiomegaly. 2. Prominence of the right perihilar shadow may represent associated central pulmonary vascular congestion related to mild congestive heart failure/volume overload. Perihilar mass or pneumonia cannot be excluded on this single exam. 3. Slight prominence of the mediastinal diameter likely accentuated by the supine patient positioning. If any chest pain, would recommend chest CT angiogram for more definitive characterization. 4. Small right pleural effusion. 5. Probable mild bilateral interstitial edema. Electronically Signed   By: Franki Cabot M.D.   On: 07/27/2015 17:18   US Thoracentesis Asp Pleural Space W/img Guide  07/30/2015  INDICATION: Patient with history of respiratory failure on ventilator, pneumonia, right pleural effusion. Request is made for diagnostic right thoracentesis. EXAM: ULTRASOUND GUIDED DIAGNOSTIC RIGHT THORACENTESIS COMPARISON:  None. MEDICATIONS: None COMPLICATIONS: None immediate TECHNIQUE: Informed written consent was obtained from the patient's wife after a discussion of the risks, benefits and alternatives to treatment. A timeout was performed prior to the initiation of the procedure. Initial ultrasound scanning demonstrates a small complex right pleural effusion. The lower chest was prepped and draped in the usual sterile fashion. 1% lidocaine was used for local anesthesia. Under direct ultrasound guidance, a 19 gauge, 10-cm, Yueh catheter was introduced. An ultrasound image was saved for documentation purposes. The thoracentesis was performed. The catheter was removed and a dressing was applied. The patient tolerated the procedure  well without immediate post procedural complication. The patient was escorted to have an upright chest radiograph. FINDINGS: A total of approximately 20 cc of clear, yellow fluid was removed. Requested samples were sent to the laboratory. The pleural fluid collection was complex/multi-septated. IMPRESSION: Successful ultrasound-guided diagnostic right sided thoracentesis yielding 20 cc of pleural fluid. Read by: Rowe Robert, PA-C Electronically Signed   By: Sandi Mariscal M.D.   On: 07/30/2015 15:55    Microbiology: Recent Results (from the past 240 hour(s))  Blood Culture (routine x 2)  Status: None   Collection Time: 07/27/15  4:30 PM  Result Value Ref Range Status   Specimen Description BLOOD LEFT HAND  Final   Special Requests BOTTLES DRAWN AEROBIC AND ANAEROBIC 5CC  Final   Culture NO GROWTH 5 DAYS  Final   Report Status 08/01/2015 FINAL  Final  Blood Culture (routine x 2)     Status: None   Collection Time: 07/27/15  4:50 PM  Result Value Ref Range Status   Specimen Description BLOOD LEFT HAND  Final   Special Requests IN PEDIATRIC BOTTLE 2CC  Final   Culture  Setup Time   Final    GRAM POSITIVE COCCI IN CLUSTERS AEROBIC BOTTLE ONLY CRITICAL RESULT CALLED TO, READ BACK BY AND VERIFIED WITH: P KEATON,RN AT [id number] 07/28/15 BY L BENFIELD    Culture   Final    STAPHYLOCOCCUS SPECIES (COAGULASE NEGATIVE) THE SIGNIFICANCE OF ISOLATING THIS ORGANISM FROM A SINGLE VENIPUNCTURE CANNOT BE PREDICTED WITHOUT FURTHER CLINICAL AND CULTURE CORRELATION. SUSCEPTIBILITIES AVAILABLE ONLY ON REQUEST.    Report Status 07/30/2015 FINAL  Final  Urine culture     Status: None   Collection Time: 07/27/15  4:50 PM  Result Value Ref Range Status   Specimen Description URINE, RANDOM  Final   Special Requests NONE  Final   Culture   Final    >=100,000 COLONIES/mL PROTEUS MIRABILIS 40,000 COLONIES/ml PSEUDOMONAS AERUGINOSA    Report Status 07/31/2015 FINAL  Final   Organism ID, Bacteria PROTEUS MIRABILIS   Final   Organism ID, Bacteria PSEUDOMONAS AERUGINOSA  Final      Susceptibility   Proteus mirabilis - MIC*    AMPICILLIN <=2 SENSITIVE Sensitive     CEFAZOLIN <=4 SENSITIVE Sensitive     CEFTRIAXONE <=1 SENSITIVE Sensitive     CIPROFLOXACIN <=0.25 SENSITIVE Sensitive     GENTAMICIN <=1 SENSITIVE Sensitive     IMIPENEM 8 INTERMEDIATE Intermediate     NITROFURANTOIN 128 RESISTANT Resistant     TRIMETH/SULFA <=20 SENSITIVE Sensitive     AMPICILLIN/SULBACTAM <=2 SENSITIVE Sensitive     PIP/TAZO <=4 SENSITIVE Sensitive     * >=100,000 COLONIES/mL PROTEUS MIRABILIS   Pseudomonas aeruginosa - MIC*    CEFTAZIDIME 2 SENSITIVE Sensitive     CIPROFLOXACIN >=4 RESISTANT Resistant     GENTAMICIN >=16 RESISTANT Resistant     IMIPENEM >=16 RESISTANT Resistant     CEFEPIME 8 SENSITIVE Sensitive     * 40,000 COLONIES/ml PSEUDOMONAS AERUGINOSA  Culture, respiratory (NON-Expectorated)     Status: None   Collection Time: 07/27/15  5:28 PM  Result Value Ref Range Status   Specimen Description TRACHEAL ASPIRATE  Final   Special Requests NONE  Final   Gram Stain   Final    ABUNDANT WBC PRESENT,BOTH PMN AND MONONUCLEAR NO SQUAMOUS EPITHELIAL CELLS SEEN NO ORGANISMS SEEN Performed at [facility]    Culture   Final    MODERATE METHICILLIN RESISTANT STAPHYLOCOCCUS AUREUS Note: RIFAMPIN AND GENTAMICIN SHOULD NOT BE USED AS SINGLE DRUGS FOR TREATMENT OF STAPH INFECTIONS. This organism DOES NOT demonstrate inducible Clindamycin resistance in vitro. CRITICAL RESULT CALLED TO, READ BACK BY AND VERIFIED WITH: NICOLE WALLER AT  [id number] ON 07/30/15 BY WEBSP Performed at [facility]    Report Status 07/30/2015 FINAL  Final   Organism ID, Bacteria METHICILLIN RESISTANT STAPHYLOCOCCUS AUREUS  Final      Susceptibility   Methicillin resistant staphylococcus aureus - MIC*    CLINDAMYCIN <=0.25 SENSITIVE Sensitive     ERYTHROMYCIN >=  8 RESISTANT Resistant     GENTAMICIN <=0.5 SENSITIVE Sensitive      LEVOFLOXACIN >=8 RESISTANT Resistant     OXACILLIN >=4 RESISTANT Resistant     RIFAMPIN <=0.5 SENSITIVE Sensitive     TRIMETH/SULFA >=320 RESISTANT Resistant     VANCOMYCIN 1 SENSITIVE Sensitive     TETRACYCLINE <=1 SENSITIVE Sensitive     * MODERATE METHICILLIN RESISTANT STAPHYLOCOCCUS AUREUS  MRSA PCR Screening     Status: Abnormal   Collection Time: 07/28/15  3:23 AM  Result Value Ref Range Status   MRSA by PCR POSITIVE (A) NEGATIVE Final    Comment:        The GeneXpert MRSA Assay (FDA approved for NASAL specimens only), is one component of a comprehensive MRSA colonization surveillance program. It is not intended to diagnose MRSA infection nor to guide or monitor treatment for MRSA infections. RESULT CALLED TO, READ BACK BY AND VERIFIED WITH: SPOKE TO RB PERRIN,J AT 6:32 ON 07/28/2015 [email or web address]   Body fluid culture     Status: None   Collection Time: 07/30/15  3:40 PM  Result Value Ref Range Status   Specimen Description FLUID RIGHT PLEURAL  Final   Special Requests NONE  Final   Gram Stain   Final    WBC PRESENT, PREDOMINANTLY MONONUCLEAR NO ORGANISMS SEEN CYTOSPIN    Culture NO GROWTH 3 DAYS  Final   Report Status 08/03/2015 FINAL  Final  C difficile quick scan w PCR reflex     Status: None   Collection Time: 08/03/15  3:33 PM  Result Value Ref Range Status   C Diff antigen NEGATIVE NEGATIVE Final   C Diff toxin NEGATIVE NEGATIVE Final   C Diff interpretation Negative for toxigenic C. difficile  Final  Culture, Urine     Status: None   Collection Time: 08/05/15  1:00 PM  Result Value Ref Range Status   Specimen Description URINE, CATHETERIZED  Final   Special Requests NONE  Final   Culture NO GROWTH 1 DAY  Final   Report Status 08/06/2015 FINAL  Final     Labs: Basic Metabolic Panel:  Recent Labs Lab 08/01/15 0324 08/01/15 1320 08/02/15 1257 08/03/15 0530 08/04/15 0500 08/05/15 0430 08/06/15 0515  NA 141  --  142 142 142 141 141  K 2.8* 2.9*  3.1* 3.0* 3.5 3.6 3.8  CL 108  --  109 108 109 108 105  CO2 27  --  28 27 28 29 29   GLUCOSE 115*  --  131* 112* 112* 117* 109*  BUN 15  --  19 19 16 19 18   CREATININE 0.45*  --  0.42* 0.40* 0.38* 0.39* 0.39*  CALCIUM 8.1*  --  8.1* 8.2* 8.4* 8.5* 8.9  MG 1.4* 2.0  --   --   --   --   --    Liver Function Tests: No results for input(s): AST, ALT, ALKPHOS, BILITOT, PROT, ALBUMIN in the last 168 hours. No results for input(s): LIPASE, AMYLASE in the last 168 hours. No results for input(s): AMMONIA in the last 168 hours. CBC:  Recent Labs Lab 08/03/15 0530 08/04/15 0500 08/05/15 0430 08/06/15 0515 08/06/15 1044  WBC 8.0 7.3 7.5 8.3 9.1  HGB 7.4* 7.6* 7.6* 7.8* 7.9*  HCT 26.0* 26.3* 26.0* 26.0* 26.2*  MCV 91.2 91.0 90.6 90.6 91.0  PLT 328 340 334 306 310   Cardiac Enzymes: No results for input(s): CKTOTAL, CKMB, CKMBINDEX, TROPONINI in the last 168 hours. BNP: BNP (last  3 results)  Recent Labs  07/27/15 1630  BNP 481.5*    ProBNP (last 3 results) No results for input(s): PROBNP in the last 8760 hours.  CBG:  Recent Labs Lab 08/05/15 2018 08/06/15 0053 08/06/15 0418 08/06/15 0750 08/06/15 1200  GLUCAP 106* 104* 105* 114* 128*       Signed:  Dia Crawford, MD [facility] [phone] pager

## 2015-08-07 LAB — TYPE AND SCREEN
ABO/RH(D): A NEG
Antibody Screen: NEGATIVE
UNIT DIVISION: 0

## 2015-12-14 DEATH — deceased

## 2016-07-17 IMAGING — XA IR US GUIDE VASC ACCESS LEFT
1 series · 2 of 2 positions shown · non-contrast
Comparison: none

CLINICAL DATA: Heart failure, tracheostomy, ventilatory support,
poor peripheral access

[Series 1: run · 2 of 2 slices shown]
[im 1/2]
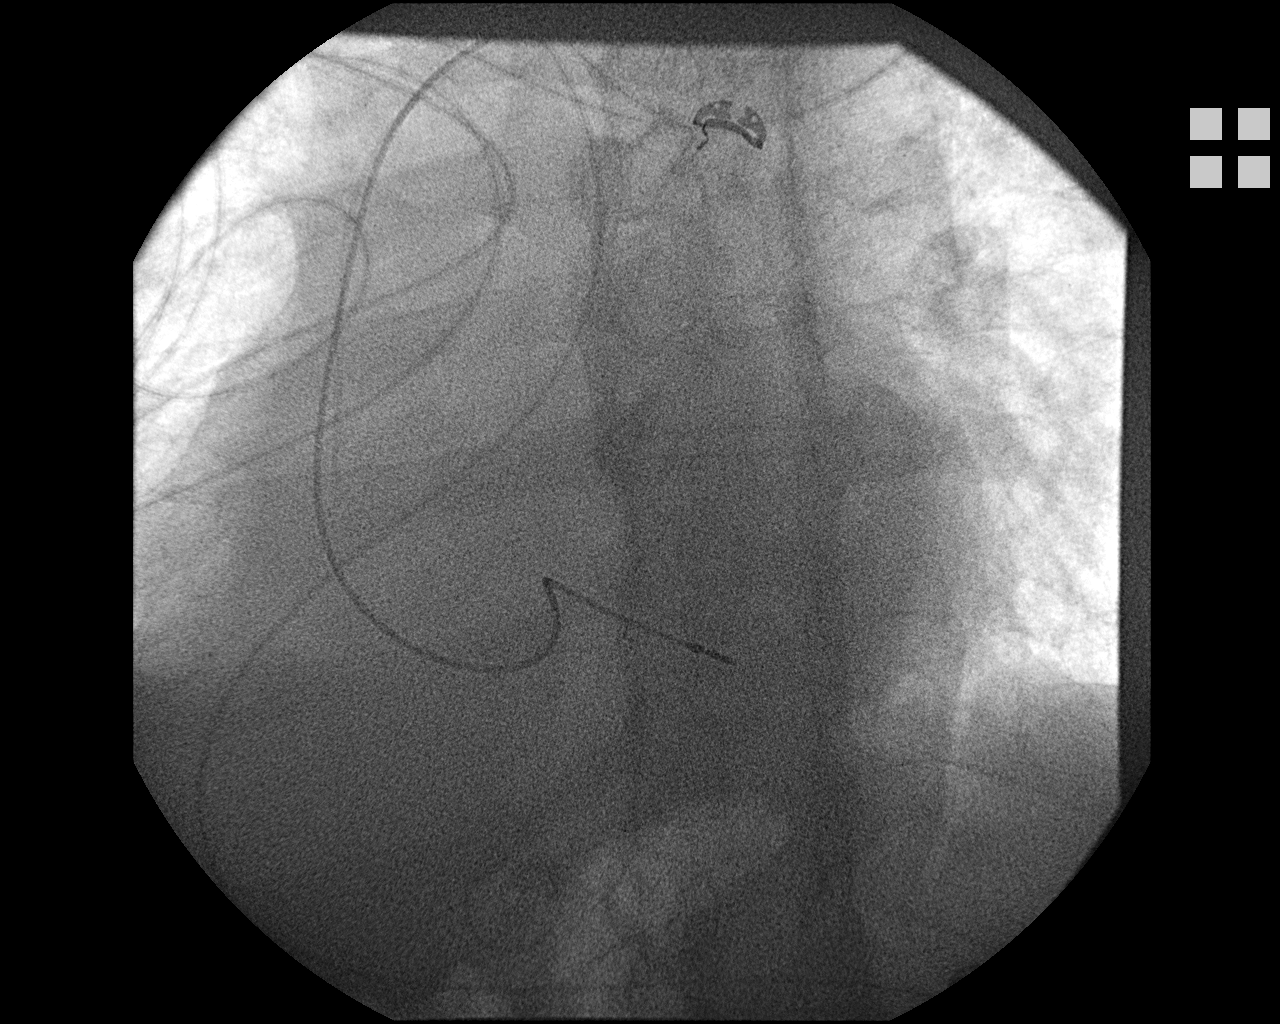
[im 2/2]
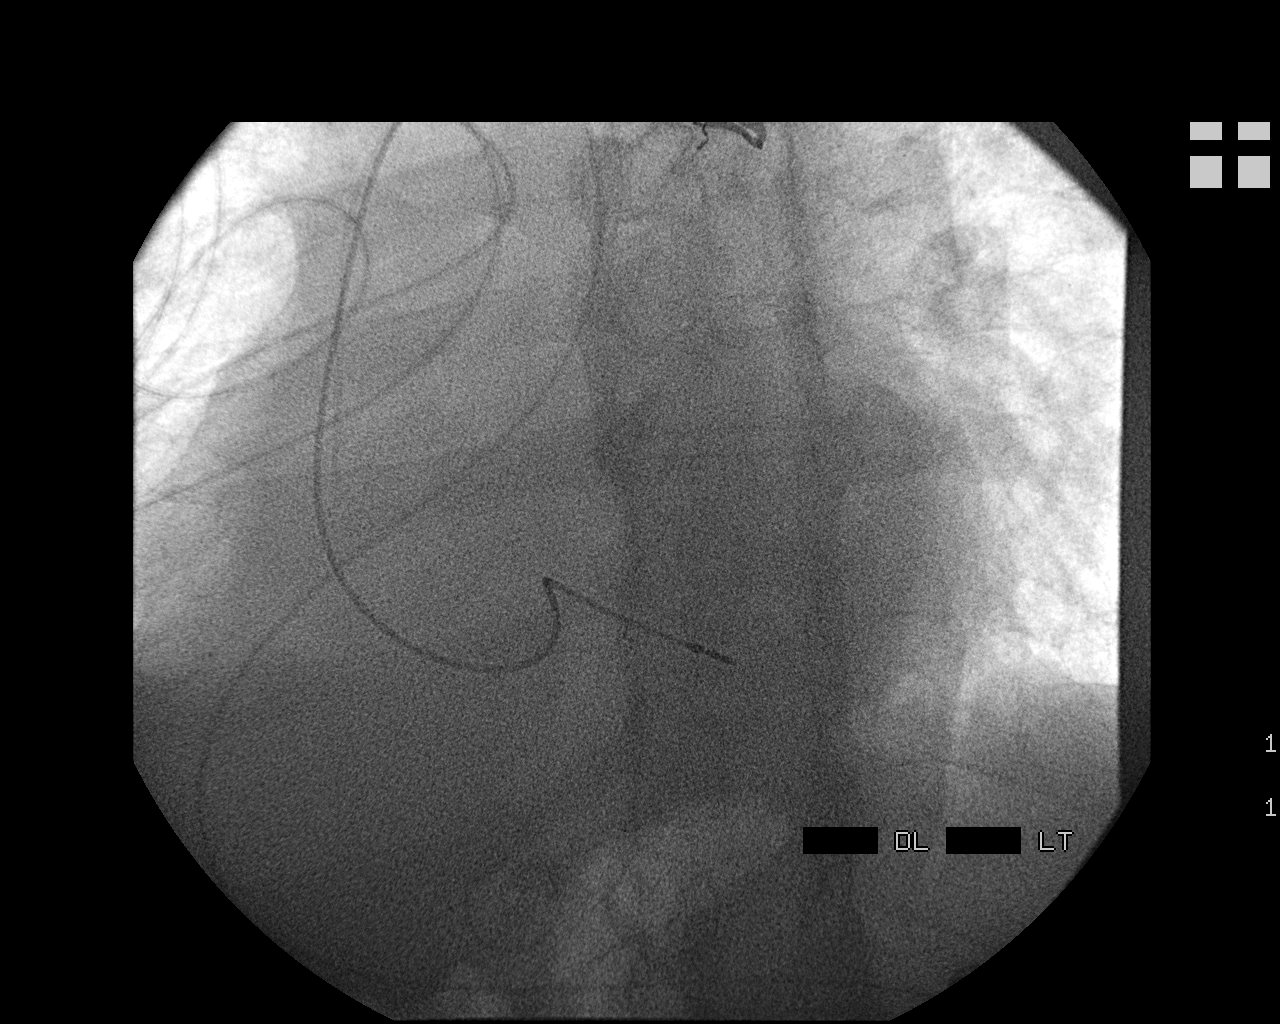

[2 of 2 positions shown; findings below may reference images not displayed]

EXAM:
POWER PICC LINE PLACEMENT WITH ULTRASOUND AND FLUOROSCOPIC GUIDANCE

FLUOROSCOPY TIME:  24 seconds

PROCEDURE:
The patient was advised of the possible risks andcomplications and
agreed to undergo the procedure. The patient was then brought to the
angiographic suite for the procedure.

The right arm was prepped with chlorhexidine, drapedin the usual
sterile fashion using maximum barrier technique (cap and mask,
sterile gown, sterile gloves, large sterile sheet, hand hygiene and
cutaneous antisepsis) and infiltrated locally with 1% Lidocaine.

Ultrasound demonstrated patency of the right basilic vein, and this
was documented with an image. Under real-time ultrasound guidance,
this vein was accessed with a 21 gauge micropuncture needle and
image documentation was performed. A [DATE] wire was introduced in to
the vein. Over this, a 5 French double lumen power PICC was advanced
to the lower SVC/right atrial junction. Fluoroscopy during the
procedure and fluoro spot radiograph confirms appropriate catheter
position. The catheter was flushed and covered with asterile
dressing.

Catheter length: 54

Complications: None immediate
IMPRESSION: Successful right arm power PICC line placement with ultrasound and
fluoroscopic guidance. The catheter is ready for use.

## 2016-07-17 IMAGING — US IR US GUIDE VASC ACCESS LEFT
1 series · 1 of 1 positions shown · non-contrast
Comparison: none

CLINICAL DATA: Heart failure, tracheostomy, ventilatory support,
poor peripheral access

[Series 1: ir fluoro/shunt/fist · 1 of 1 slices shown]
[im 1/1]
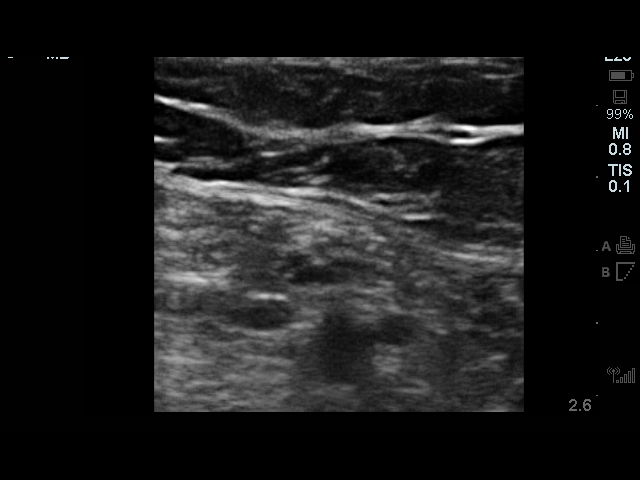

[1 of 1 positions shown; findings below may reference images not displayed]

EXAM:
POWER PICC LINE PLACEMENT WITH ULTRASOUND AND FLUOROSCOPIC GUIDANCE

FLUOROSCOPY TIME:  24 seconds

PROCEDURE:
The patient was advised of the possible risks andcomplications and
agreed to undergo the procedure. The patient was then brought to the
angiographic suite for the procedure.

The right arm was prepped with chlorhexidine, drapedin the usual
sterile fashion using maximum barrier technique (cap and mask,
sterile gown, sterile gloves, large sterile sheet, hand hygiene and
cutaneous antisepsis) and infiltrated locally with 1% Lidocaine.

Ultrasound demonstrated patency of the right basilic vein, and this
was documented with an image. Under real-time ultrasound guidance,
this vein was accessed with a 21 gauge micropuncture needle and
image documentation was performed. A [DATE] wire was introduced in to
the vein. Over this, a 5 French double lumen power PICC was advanced
to the lower SVC/right atrial junction. Fluoroscopy during the
procedure and fluoro spot radiograph confirms appropriate catheter
position. The catheter was flushed and covered with asterile
dressing.

Catheter length: 54

Complications: None immediate
IMPRESSION: Successful right arm power PICC line placement with ultrasound and
fluoroscopic guidance. The catheter is ready for use.

## 2017-04-17 IMAGING — US US THORACENTESIS ASP PLEURAL SPACE W/IMG GUIDE
1 series · 3 of 3 positions shown · non-contrast
Comparison: None.

MEDICATIONS:
None

COMPLICATIONS:
None immediate

INDICATION: Patient with history of respiratory failure on ventilator,
pneumonia, right pleural effusion. Request is made for diagnostic
right thoracentesis.

EXAM:
ULTRASOUND GUIDED DIAGNOSTIC RIGHT THORACENTESIS
TECHNIQUE: Informed written consent was obtained from the patient's wife after
a discussion of the risks, benefits and alternatives to treatment. A
timeout was performed prior to the initiation of the procedure.

[Series 1: us thoracentesis asp pleural space w/img guide · 0.26mm/px · 3 of 3 slices shown]
[im 1/3]
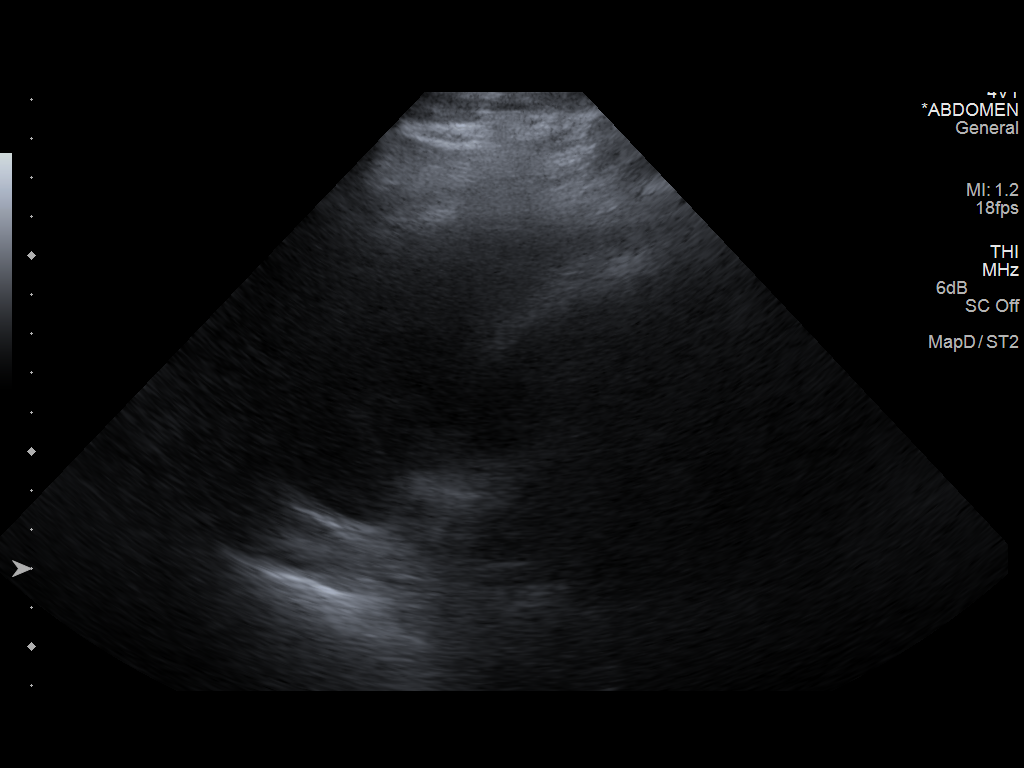
[im 2/3]
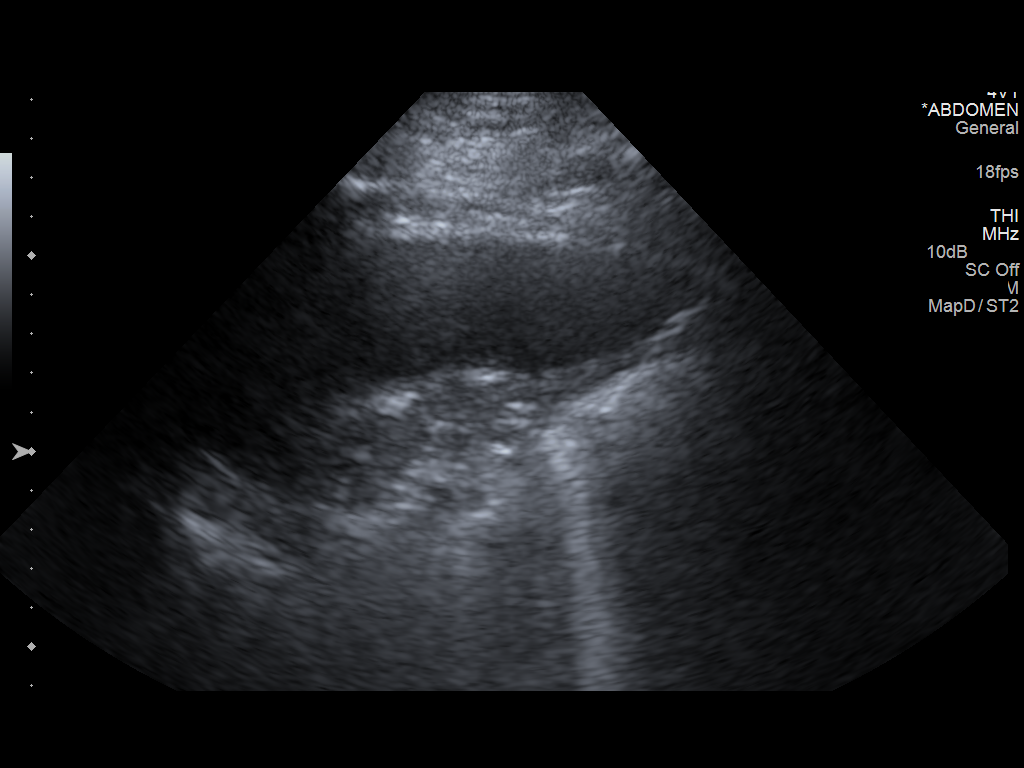
[im 3/3]
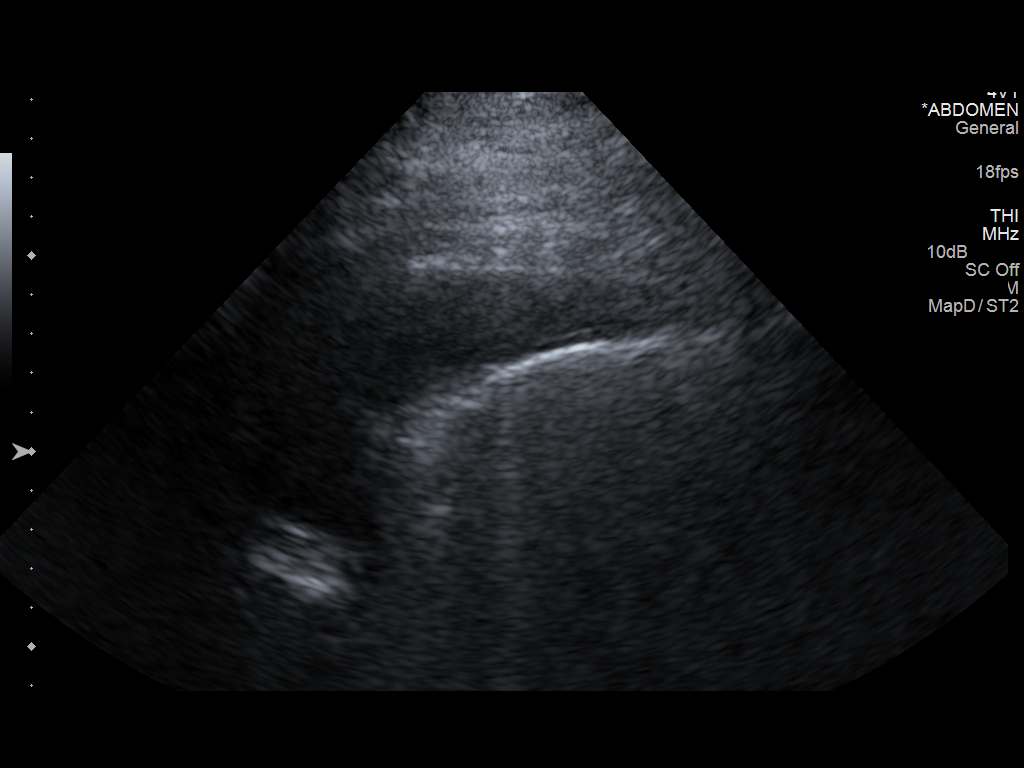

[3 of 3 positions shown; findings below may reference images not displayed]

Initial ultrasound scanning demonstrates a small complex right
pleural effusion. The lower chest was prepped and draped in the
usual sterile fashion. 1% lidocaine was used for local anesthesia.

Under direct ultrasound guidance, a 19 gauge, 10-cm, Yueh catheter
was introduced. An ultrasound image was saved for documentation
purposes. The thoracentesis was performed. The catheter was removed
and a dressing was applied. The patient tolerated the procedure well
without immediate post procedural complication. The patient was
escorted to have an upright chest radiograph.
FINDINGS: A total of approximately 20 cc of clear, yellow fluid was removed.
Requested samples were sent to the laboratory. The pleural fluid
collection was complex/multi-septated.
IMPRESSION: Successful ultrasound-guided diagnostic right sided thoracentesis
yielding 20 cc of pleural fluid.
